# Patient Record
Sex: Female | Born: 1990
Health system: Southern US, Community
[De-identification: ages and names within clinical notes are randomized; demographics above are authoritative.]

## PROBLEM LIST (undated history)

## (undated) ENCOUNTER — Inpatient Hospital Stay: Payer: Self-pay

## (undated) DIAGNOSIS — F32A Depression, unspecified: Secondary | ICD-10-CM

## (undated) DIAGNOSIS — B019 Varicella without complication: Secondary | ICD-10-CM

## (undated) DIAGNOSIS — E538 Deficiency of other specified B group vitamins: Secondary | ICD-10-CM

## (undated) DIAGNOSIS — F419 Anxiety disorder, unspecified: Secondary | ICD-10-CM

## (undated) DIAGNOSIS — K589 Irritable bowel syndrome without diarrhea: Secondary | ICD-10-CM

## (undated) DIAGNOSIS — O139 Gestational [pregnancy-induced] hypertension without significant proteinuria, unspecified trimester: Secondary | ICD-10-CM

## (undated) DIAGNOSIS — F53 Postpartum depression: Secondary | ICD-10-CM

## (undated) DIAGNOSIS — O99345 Other mental disorders complicating the puerperium: Secondary | ICD-10-CM

## (undated) HISTORY — DX: Anxiety disorder, unspecified: F41.9

## (undated) HISTORY — DX: Other mental disorders complicating the puerperium: O99.345

## (undated) HISTORY — DX: Irritable bowel syndrome, unspecified: K58.9

## (undated) HISTORY — DX: Varicella without complication: B01.9

## (undated) HISTORY — DX: Depression, unspecified: F32.A

## (undated) HISTORY — DX: Deficiency of other specified B group vitamins: E53.8

## (undated) HISTORY — DX: Postpartum depression: F53.0

## (undated) HISTORY — PX: WISDOM TOOTH EXTRACTION: SHX21

## (undated) HISTORY — DX: Gestational (pregnancy-induced) hypertension without significant proteinuria, unspecified trimester: O13.9

---
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
## 2011-12-07 ENCOUNTER — Other Ambulatory Visit: Payer: Self-pay | Admitting: Gastroenterology

## 2011-12-22 ENCOUNTER — Other Ambulatory Visit: Payer: Self-pay | Admitting: Gastroenterology

## 2014-04-20 ENCOUNTER — Ambulatory Visit: Payer: Self-pay | Admitting: Obstetrics and Gynecology

## 2014-04-29 ENCOUNTER — Inpatient Hospital Stay: Payer: Self-pay

## 2014-04-29 DIAGNOSIS — O4290 Premature rupture of membranes, unspecified as to length of time between rupture and onset of labor, unspecified weeks of gestation: Secondary | ICD-10-CM

## 2014-04-29 LAB — CBC WITH DIFFERENTIAL/PLATELET
BASOS PCT: 0.3 %
Basophil #: 0 10*3/uL (ref 0.0–0.1)
Eosinophil #: 0.1 10*3/uL (ref 0.0–0.7)
Eosinophil %: 0.7 %
HCT: 39 % (ref 35.0–47.0)
HGB: 12.9 g/dL (ref 12.0–16.0)
LYMPHS ABS: 2 10*3/uL (ref 1.0–3.6)
LYMPHS PCT: 15.2 %
MCH: 29.7 pg (ref 26.0–34.0)
MCHC: 33.1 g/dL (ref 32.0–36.0)
MCV: 90 fL (ref 80–100)
MONOS PCT: 8.8 %
Monocyte #: 1.2 x10 3/mm — ABNORMAL HIGH (ref 0.2–0.9)
NEUTROS ABS: 9.9 10*3/uL — AB (ref 1.4–6.5)
NEUTROS PCT: 75 %
PLATELETS: 193 10*3/uL (ref 150–440)
RBC: 4.36 10*6/uL (ref 3.80–5.20)
RDW: 12.9 % (ref 11.5–14.5)
WBC: 13.3 10*3/uL — AB (ref 3.6–11.0)

## 2014-04-29 LAB — PROTEIN / CREATININE RATIO, URINE
CREATININE, URINE: 79.4 mg/dL (ref 30.0–125.0)
PROTEIN, RANDOM URINE: 86 mg/dL — AB (ref 0–12)
Protein/Creat. Ratio: 1083 mg/gCREAT — ABNORMAL HIGH (ref 0–200)

## 2014-04-30 LAB — GC/CHLAMYDIA PROBE AMP

## 2014-04-30 LAB — HEMATOCRIT: HCT: 31.9 % — AB (ref 35.0–47.0)

## 2014-08-09 NOTE — H&P (Signed)
L&D Evaluation:  History:  HPI 24yo G1P0000 presents at [redacted]w[redacted]d with complaint of leaking fluid and contractions since 480XK, pregnancy complicated by mild PIH x 1 week.   Presents with contractions, leaking fluid   Patient's Medical History endometriosis; anxiety; IBS; GERD   Patient's Surgical History T&A   Medications Pre Natal Vitamins  protonix   Allergies NKDA   Social History none   Family History Non-Contributory   ROS:  ROS All systems were reviewed.  HEENT, CNS, GI, GU, Respiratory, CV, Renal and Musculoskeletal systems were found to be normal.   Exam:  Vital Signs stable   General no apparent distress   Mental Status clear  anxious   Estimated Fetal Weight Average for gestational age   Fetal Position vtx   Edema 1+   Pelvic 5/100/0   Mebranes NTZ?   FHT normal rate with no decels   FHT Description Variable decelerations   Fetal Heart Rate 120   Ucx irregular   Ucx Frequency 5 min   Length of each Contraction 60 seconds   Impression:  Impression active labor, evaluation for PIH, PTL   Plan:  Plan EFM/NST, monitor contractions and for cervical change, monitor BP, PIH panel, antibiotics for GBBS prophylaxis, alert neonatology of PTL and GBS prophylaxis   Electronic Signatures: Evonnie Pat (CNM)  (Signed 29-Jan-16 10:53)  Authored: L&D Evaluation   Last Updated: 29-Jan-16 10:53 by Evonnie Pat (CNM)

## 2014-08-22 DIAGNOSIS — F53 Postpartum depression: Secondary | ICD-10-CM | POA: Insufficient documentation

## 2014-08-22 DIAGNOSIS — K219 Gastro-esophageal reflux disease without esophagitis: Secondary | ICD-10-CM | POA: Insufficient documentation

## 2014-08-22 DIAGNOSIS — N809 Endometriosis, unspecified: Secondary | ICD-10-CM

## 2014-08-22 DIAGNOSIS — F419 Anxiety disorder, unspecified: Secondary | ICD-10-CM

## 2014-08-22 DIAGNOSIS — F329 Major depressive disorder, single episode, unspecified: Secondary | ICD-10-CM | POA: Insufficient documentation

## 2014-08-22 DIAGNOSIS — R5383 Other fatigue: Secondary | ICD-10-CM | POA: Insufficient documentation

## 2014-08-22 DIAGNOSIS — O99345 Other mental disorders complicating the puerperium: Secondary | ICD-10-CM

## 2014-08-22 DIAGNOSIS — K589 Irritable bowel syndrome without diarrhea: Secondary | ICD-10-CM | POA: Insufficient documentation

## 2014-10-06 ENCOUNTER — Encounter: Payer: Self-pay | Admitting: Obstetrics and Gynecology

## 2014-10-06 ENCOUNTER — Ambulatory Visit (INDEPENDENT_AMBULATORY_CARE_PROVIDER_SITE_OTHER): Payer: BLUE CROSS/BLUE SHIELD | Admitting: Obstetrics and Gynecology

## 2014-10-06 VITALS — BP 125/82 | HR 63 | Ht 65.0 in | Wt 169.2 lb

## 2014-10-06 DIAGNOSIS — O99345 Other mental disorders complicating the puerperium: Principal | ICD-10-CM

## 2014-10-06 DIAGNOSIS — Z308 Encounter for other contraceptive management: Secondary | ICD-10-CM | POA: Diagnosis not present

## 2014-10-06 DIAGNOSIS — F53 Postpartum depression: Secondary | ICD-10-CM

## 2014-10-06 MED ORDER — PANTOPRAZOLE SODIUM 40 MG PO TBEC: 40.0000 mg | DELAYED_RELEASE_TABLET | Freq: Every day | ORAL | Status: DC

## 2014-10-06 MED ORDER — ESCITALOPRAM OXALATE 10 MG PO TABS: 10.0000 mg | ORAL_TABLET | Freq: Every day | ORAL | Status: DC

## 2014-10-06 MED ORDER — DESOGESTREL-ETHINYL ESTRADIOL 0.15-0.02/0.01 MG (21/5) PO TABS: 1.0000 | ORAL_TABLET | Freq: Every day | ORAL | Status: DC

## 2014-10-06 NOTE — Progress Notes (Signed)
Subjective:     Patient ID: Toni Mccormick, female   DOB: 09/25/1990, 24 y.o.   MRN: [id number]  HPI Here to review medications for PPD and [facility]  Review of Systems Feeling much better on lexapro, desires continuing at this time. Need to switch to regular HC as she is no longer breastfeeding,had first menses since delivery last week.    Objective:   Physical Exam A&O x 4 Well groomed, in no apparent distress. Pelvic exam not indicated Mood and affect appropriate without signs of anxiety or depression.    Assessment:     Postpartum depression under good control on current SSRI. [facility] management     Plan:     Refilled medication and change BC to [place].  RTC 4-6 weeks for AE.   Romyn  Trudee Kuster, CNM

## 2014-11-01 ENCOUNTER — Encounter: Payer: Self-pay | Admitting: Family Medicine

## 2014-11-01 ENCOUNTER — Ambulatory Visit (INDEPENDENT_AMBULATORY_CARE_PROVIDER_SITE_OTHER): Payer: BLUE CROSS/BLUE SHIELD | Admitting: Family Medicine

## 2014-11-01 VITALS — BP 112/82 | HR 69 | Temp 97.9°F | Ht 65.0 in | Wt 170.0 lb

## 2014-11-01 DIAGNOSIS — R5383 Other fatigue: Secondary | ICD-10-CM

## 2014-11-01 DIAGNOSIS — F419 Anxiety disorder, unspecified: Secondary | ICD-10-CM

## 2014-11-01 DIAGNOSIS — F418 Other specified anxiety disorders: Secondary | ICD-10-CM | POA: Diagnosis not present

## 2014-11-01 DIAGNOSIS — F329 Major depressive disorder, single episode, unspecified: Secondary | ICD-10-CM

## 2014-11-01 DIAGNOSIS — F32A Depression, unspecified: Secondary | ICD-10-CM

## 2014-11-01 NOTE — Assessment & Plan Note (Signed)
New problem. Unclear etiology. DDx - Related to social issues (25 month old, lack of sleep), Thyroid dysfunction, Anemia. Obtaining lab studies today: TSH, CBC, Vitamin D. Patient to continue healthy diet and exercise.

## 2014-11-01 NOTE — Progress Notes (Signed)
   Subjective:    Patient ID: Toni Mccormick, female    DOB: [birth year], 24 y.o.   MRN: [id number]  HPI 24 year old female presents to the clinic today to establish care. Chief complaint: Fatigue.  1) Fatigue  Patient reports that for the past several months she's been experiencing significant fatigue.  Patient states that she finds it difficult to concentrate at work. She is significant tiredness during the day.  She feels like this is out of portion to normal fatigue experienced postpartum.  She states that she is eating well and exercises regularly (5 times a week).  No reported new stressors as of late.  No exacerbating or relieving factors.  No other associated symptoms. No weight gain, constipation, hair loss, fever, chills.   2) Depression  Well controlled per her report.  She is doing well on lexapro.  PMH, Surgical Hx, Family Hx, Social History reviewed and updated as below.  Past Medical History  Diagnosis Date  . Post partum depression   . Chicken pox   . Gestational hypertension    Past Surgical History  Procedure Laterality Date  . Tonsillectomy and adenoidectomy Bilateral 1992   Family History  Problem Relation Age of Onset  . Hyperlipidemia Father   . Hypertension Father   . Hyperlipidemia Maternal Grandmother   . Cancer Maternal Grandfather     lung  . Diabetes Paternal Grandmother   . Arthritis Paternal Grandfather   . Cancer Paternal Grandfather     lung   History   Social History  . Marital Status: Married    Spouse Name: N/A  . Number of Children: N/A  . Years of Education: N/A   Social History Main Topics  . Smoking status: Never Smoker   . Smokeless tobacco: Never Used  . Alcohol Use: Yes     Comment: occasionally  . Drug Use: No  . Sexual Activity: Yes    Birth Control/ Protection: Pill   Other Topics Concern  . None   Social History Narrative   Review of Systems Per HPI. All other systems negative.      Objective: Blood pressure 112/82, pulse 69, temperature 97.9 F (36.6 C), temperature source Oral, height 5\' 5"  (1.651 m), weight 170 lb (77.111 kg), last menstrual period 10/06/2014, SpO2 97 %, not currently breastfeeding. Body mass index is 28.29 kg/(m^2).    Physical Exam  Constitutional: She is oriented to person, place, and time. She appears well-developed and well-nourished. No distress.  HENT:  Head: Normocephalic and atraumatic.  Nose: Nose normal.  Mouth/Throat: Oropharynx is clear and moist. No oropharyngeal exudate.  Normal TM's bilaterally.   Eyes: Conjunctivae are normal. No scleral icterus.  Neck: Neck supple. No thyromegaly present.  Cardiovascular: Normal rate and regular rhythm.   No murmur heard. Pulmonary/Chest: Effort normal and breath sounds normal. She has no wheezes. She has no rales.  Abdominal: Soft. She exhibits no distension. There is no tenderness. There is no rebound and no guarding.  Musculoskeletal: Normal range of motion. She exhibits no edema.  Lymphadenopathy:    She has no cervical adenopathy.  Neurological: She is alert and oriented to person, place, and time.  Skin: Skin is warm and dry. No rash noted.  Psychiatric:  Flat affect noted.  Vitals reviewed.     Assessment & Plan:  See Problem List

## 2014-11-01 NOTE — Patient Instructions (Signed)
It was to see you today.  We will call you with the lab results.  You can go ahead and start supplementation with Vitamin D while awaiting the results. Take Vitamin D3 1000 IU daily.  Follow up annually or sooner if needed.   Take care  Dr. Lacinda Axon

## 2014-11-01 NOTE — Assessment & Plan Note (Signed)
Stable. Patient to continue lexapro.

## 2014-11-01 NOTE — Progress Notes (Signed)
Pre visit review using our clinic review tool, if applicable. No additional management support is needed unless otherwise documented below in the visit note. 

## 2014-11-02 ENCOUNTER — Telehealth: Payer: Self-pay

## 2014-11-02 LAB — TSH: TSH: 0.77 u[IU]/mL (ref 0.35–4.50)

## 2014-11-02 LAB — VITAMIN D 25 HYDROXY (VIT D DEFICIENCY, FRACTURES): VITD: 20.33 ng/mL — AB (ref 30.00–100.00)

## 2014-11-02 LAB — CBC
HEMATOCRIT: 41.7 % (ref 36.0–46.0)
Hemoglobin: 13.9 g/dL (ref 12.0–15.0)
MCHC: 33.3 g/dL (ref 30.0–36.0)
MCV: 89.4 fl (ref 78.0–100.0)
PLATELETS: 309 10*3/uL (ref 150.0–400.0)
RBC: 4.67 Mil/uL (ref 3.87–5.11)
RDW: 13.3 % (ref 11.5–15.5)
WBC: 9.7 10*3/uL (ref 4.0–10.5)

## 2014-11-02 NOTE — Telephone Encounter (Signed)
Pt was called to give lab results, but there was no answer so a message was left to callback to get those lab results.

## 2014-11-03 ENCOUNTER — Telehealth: Payer: Self-pay

## 2014-11-03 NOTE — Telephone Encounter (Signed)
Pt was called to inform her of her lab results but there was no answer. i left a voicemail instructing for her to call me back to get those results.

## 2014-11-03 NOTE — Telephone Encounter (Signed)
Pt called back and i gave her the results of her lab work.

## 2014-11-03 NOTE — Telephone Encounter (Signed)
Patient would like a call back about results from her most recent labwork.

## 2014-11-10 ENCOUNTER — Encounter: Payer: BLUE CROSS/BLUE SHIELD | Admitting: Obstetrics and Gynecology

## 2014-12-16 ENCOUNTER — Telehealth: Payer: Self-pay | Admitting: Family Medicine

## 2014-12-16 NOTE — Telephone Encounter (Signed)
Decrease to 5 mg daily (1/2 tablet) for 1 week, then stop.

## 2014-12-16 NOTE — Telephone Encounter (Signed)
Please advise 

## 2014-12-16 NOTE — Telephone Encounter (Signed)
Pt was told about what decrease for one week then she could stop

## 2014-12-16 NOTE — Telephone Encounter (Signed)
Pt called about being on lexapro after when she had her child. Pt is having side a effects of having no sex drive. Pt wants to know the safest way to stop taking the medication. Call number [phone] during hours of 8:30-5 Mon-Fri.  Thank You!

## 2015-02-08 ENCOUNTER — Encounter: Payer: Self-pay | Admitting: Family Medicine

## 2015-02-08 ENCOUNTER — Ambulatory Visit (INDEPENDENT_AMBULATORY_CARE_PROVIDER_SITE_OTHER): Payer: BLUE CROSS/BLUE SHIELD | Admitting: Family Medicine

## 2015-02-08 VITALS — BP 116/82 | HR 88 | Temp 97.7°F | Ht 65.0 in | Wt 172.5 lb

## 2015-02-08 DIAGNOSIS — R45851 Suicidal ideations: Secondary | ICD-10-CM | POA: Diagnosis not present

## 2015-02-08 DIAGNOSIS — F419 Anxiety disorder, unspecified: Principal | ICD-10-CM

## 2015-02-08 DIAGNOSIS — F418 Other specified anxiety disorders: Secondary | ICD-10-CM

## 2015-02-08 DIAGNOSIS — F329 Major depressive disorder, single episode, unspecified: Secondary | ICD-10-CM

## 2015-02-08 MED ORDER — BUPROPION HCL ER (XL) 150 MG PO TB24: 150.0000 mg | ORAL_TABLET | Freq: Every day | ORAL | Status: DC

## 2015-02-08 NOTE — Patient Instructions (Signed)
Take the medication daily.  Call the number below to set up your appt (I was unable to do so due to the 2 week notice w/ your employer): [phone]  Follow up in ~ 2 weeks.  Take care  Dr. Lacinda Axon

## 2015-02-09 ENCOUNTER — Telehealth: Payer: Self-pay | Admitting: *Deleted

## 2015-02-09 ENCOUNTER — Other Ambulatory Visit: Payer: Self-pay

## 2015-02-09 DIAGNOSIS — F329 Major depressive disorder, single episode, unspecified: Secondary | ICD-10-CM

## 2015-02-09 DIAGNOSIS — F419 Anxiety disorder, unspecified: Principal | ICD-10-CM

## 2015-02-09 DIAGNOSIS — R45851 Suicidal ideations: Secondary | ICD-10-CM | POA: Insufficient documentation

## 2015-02-09 MED ORDER — BUPROPION HCL ER (XL) 150 MG PO TB24: 150.0000 mg | ORAL_TABLET | Freq: Every day | ORAL | Status: DC

## 2015-02-09 NOTE — Telephone Encounter (Signed)
Patient wanted to [facility] the office that she uses [employer] on [address] in [place]

## 2015-02-09 NOTE — Telephone Encounter (Signed)
Has been added to her chart

## 2015-02-09 NOTE — Assessment & Plan Note (Addendum)
Worsening after discontinuation of Lexapro and recent life stressors. I'm starting her on Wellbutrin (given sexual dysfunction on Lexapro).

## 2015-02-09 NOTE — Assessment & Plan Note (Addendum)
I am very concerned especially since she has a young child at home. She denies having a plan. I recommend that she see a psychologist/behavioral health specialist regarding therapy. She is in agreement regarding this but requests somebody who shares the same [place] values. I attempted to schedule her a visit at the [facility] office but was unable to do so due to her scheduling conflicts. I gave her the phone number so she can schedule her appointment. She states that she will do this. She is to follow-up with me in 2 weeks. I advised her as well as her husband that if suicidal thoughts recur or she thinks about developing a plan and she should go to the hospital for urgent evaluation. They are in agreement.

## 2015-02-09 NOTE — Progress Notes (Signed)
Subjective:  Patient ID: Toni Mccormick, female    DOB: Jan 03, 1991  Age: 24 y.o. MRN: [id number]  CC: Depression/Anxiety  HPI:  24 year old female presents to the clinic today with worsening anxiety and depression.  Patient previously had postpartum depression after the birth of her son earlier this year. She was placed on Lexapro by her OB/GYN office.  At our first visit she was doing well regarding her anxiety and depression. However she later developed decrease in sex drive which prompted her to discontinue the Lexapro.  Since that time she's had intermittent difficulty with anxiety and depression. She is also has some recent life stressors as well. Additionally, she has had a recent thought of suicide. She states that she had an image of her shooting herself and thought it would be quite easy. She denies any plan. She states that she was scared by this which prompted her to come in today.  Social Hx   Social History   Social History  . Marital Status: Married    Spouse Name: N/A  . Number of Children: N/A  . Years of Education: N/A   Social History Main Topics  . Smoking status: Never Smoker   . Smokeless tobacco: Never Used  . Alcohol Use: Yes     Comment: occasionally  . Drug Use: No  . Sexual Activity: Yes    Birth Control/ Protection: Pill   Other Topics Concern  . None   Social History Narrative   Review of Systems  Constitutional: Negative.   Psychiatric/Behavioral: Positive for suicidal ideas. The patient is nervous/anxious.    Objective:  BP 116/82 mmHg  Pulse 88  Temp(Src) 97.7 F (36.5 C) (Oral)  Ht 5\' 5"  (1.651 m)  Wt 172 lb 8 oz (78.245 kg)  BMI 28.71 kg/m2  SpO2 98%  Breastfeeding? No  BP/Weight 02/08/2015 [id number]  Systolic BP [id number]  Diastolic BP 82 82 82  Wt. (Lbs) 172.5 170 169.2  BMI 28.71 28.29 28.16   Physical Exam  Constitutional: She is oriented to person, place, and time. She appears well-developed. No distress.    HENT:  Head: Normocephalic and atraumatic.  Eyes: Conjunctivae are normal.  Pulmonary/Chest: Effort normal.  Neurological: She is alert and oriented to person, place, and time.  Psychiatric:  Flat affect.  Depressed mood.   Lab Results  Component Value Date   WBC 9.7 11/01/2014   HGB 13.9 11/01/2014   HCT 41.7 11/01/2014   PLT 309.0 11/01/2014   TSH 0.77 11/01/2014   Assessment & Plan:   Problem List Items Addressed This Visit    Suicidal thoughts    I am very concerned especially since she has a young child at home. She denies having a plan. I recommend that she see a psychologist/behavioral health specialist regarding therapy. She is in agreement regarding this but requests somebody who shares the same [place] values. I attempted to schedule her a visit at the [facility] office but was unable to do so due to her scheduling conflicts. I gave her the phone number so she can schedule her appointment. She states that she will do this. She is to follow-up with me in 2 weeks. I advised her as well as her husband that if suicidal thoughts recur or she thinks about developing a plan and she should go to the hospital for urgent evaluation. They are in agreement.      Anxiety and depression - Primary    Worsening after discontinuation of Lexapro  and recent life stressors. I'm starting her on Wellbutrin (given sexual dysfunction on Lexapro).           Meds ordered this encounter  Medications  . buPROPion (WELLBUTRIN XL) 150 MG 24 hr tablet    Sig: Take 1 tablet (150 mg total) by mouth daily.    Dispense:  30 tablet    Refill:  2    Follow-up: 2 weeks  Thersa Salt, DO

## 2015-03-02 ENCOUNTER — Telehealth: Payer: Self-pay | Admitting: Family Medicine

## 2015-03-02 NOTE — Telephone Encounter (Signed)
Pt called about needing her medication buPROPion (WELLBUTRIN XL) 150 MG 24 hr tablet. It needs to be called into her mail order pharmacy [facility] [phone]. Thank You!  FYI this message was on the triage voicemail.

## 2015-03-03 MED ORDER — BUPROPION HCL ER (XL) 150 MG PO TB24: 150.0000 mg | ORAL_TABLET | Freq: Every day | ORAL | Status: DC

## 2015-03-03 NOTE — Telephone Encounter (Signed)
Sent new RX to mail order pharmacy

## 2015-04-24 ENCOUNTER — Other Ambulatory Visit: Payer: Self-pay | Admitting: Family Medicine

## 2015-04-24 NOTE — Telephone Encounter (Signed)
Patient called to see what medications she'd like to discuss or refilled. To save her a visit if it was something we could fix here at the office without her coming in.

## 2015-04-25 ENCOUNTER — Ambulatory Visit: Payer: BLUE CROSS/BLUE SHIELD | Admitting: Family Medicine

## 2015-06-13 ENCOUNTER — Encounter: Payer: Self-pay | Admitting: Obstetrics and Gynecology

## 2015-06-13 ENCOUNTER — Ambulatory Visit (INDEPENDENT_AMBULATORY_CARE_PROVIDER_SITE_OTHER): Payer: BLUE CROSS/BLUE SHIELD | Admitting: Obstetrics and Gynecology

## 2015-06-13 ENCOUNTER — Other Ambulatory Visit: Payer: Self-pay | Admitting: Obstetrics and Gynecology

## 2015-06-13 VITALS — BP 131/91 | HR 74 | Ht 65.0 in | Wt 164.6 lb

## 2015-06-13 DIAGNOSIS — Z01419 Encounter for gynecological examination (general) (routine) without abnormal findings: Secondary | ICD-10-CM

## 2015-06-13 NOTE — Progress Notes (Signed)
  Subjective:     Toni Mccormick is a 25 y.o. female and is here for a comprehensive physical exam. The patient reports anxiety, but no panic attacks since switching to wellbutrin..  Social History   Social History  . Marital Status: Married    Spouse Name: N/A  . Number of Children: N/A  . Years of Education: N/A   Occupational History  . Not on file.   Social History Main Topics  . Smoking status: Never Smoker   . Smokeless tobacco: Never Used  . Alcohol Use: Yes     Comment: occasionally  . Drug Use: No  . Sexual Activity: Yes    Birth Control/ Protection: Pill   Other Topics Concern  . Not on file   Social History Narrative   Health Maintenance  Topic Date Due  . HIV Screening  05/19/2003  . TETANUS/TDAP  05/19/2007  . PAP SMEAR  05/18/2009  . INFLUENZA VACCINE  10/31/2014    The following portions of the patient's history were reviewed and updated as appropriate: allergies, current medications, past family history, past medical history, past social history, past surgical history and problem list.  Review of Systems Pertinent items noted in HPI and remainder of comprehensive ROS otherwise negative.   Objective:    General appearance: alert, cooperative and appears stated age Neck: no adenopathy, no carotid bruit, no JVD, supple, symmetrical, trachea midline and thyroid not enlarged, symmetric, no tenderness/mass/nodules Lungs: clear to auscultation bilaterally Breasts: normal appearance, no masses or tenderness Heart: regular rate and rhythm, S1, S2 normal, no murmur, click, rub or gallop Abdomen: soft, non-tender; bowel sounds normal; no masses,  no organomegaly Pelvic: cervix normal in appearance, external genitalia normal, no adnexal masses or tenderness, no cervical motion tenderness, rectovaginal septum normal, uterus normal size, shape, and consistency and vagina normal without discharge    Assessment:    Healthy female exam. Overweight; anxiety  with depression, GERD, OCP use      Plan:  Pap obtained, counseled on increasing wellbutrin to 300mg  daily- states she will consider.  RTC 1 year or as needed.   See After Visit Summary for Counseling Recommendations

## 2015-06-13 NOTE — Patient Instructions (Signed)
Place annual gynecologic exam patient instructions here.

## 2015-06-14 LAB — CYTOLOGY - PAP

## 2015-07-11 ENCOUNTER — Other Ambulatory Visit: Payer: Self-pay | Admitting: Family Medicine

## 2015-07-30 IMAGING — US US EXTREM LOW VENOUS*R*
1 series · 14 of 24 positions shown · non-contrast
Comparison: None.

CLINICAL DATA: Right leg swelling

EXAM:
RIGHT LOWER EXTREMITY VENOUS DUPLEX ULTRASOUND
TECHNIQUE: Doppler venous assessment of the left lower extremity deep venous
system was performed, including characterization of spectral flow,
compressibility, and phasicity.

[Series 1: us extrem low venous*right* · 0.09mm/px · 14 of 34 slices shown]
[im 1/34]
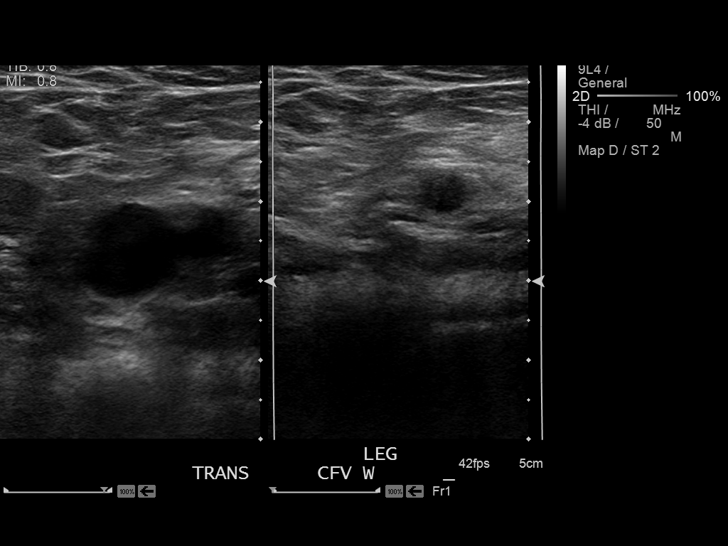
[im 3/34]
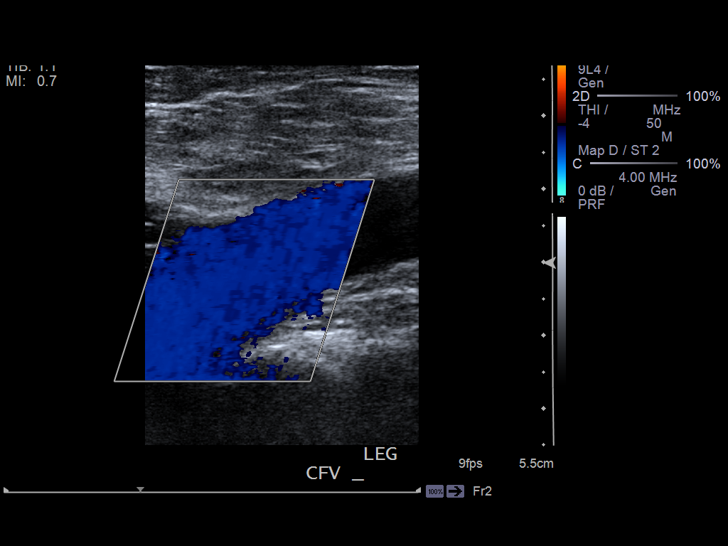
[im 6/34]
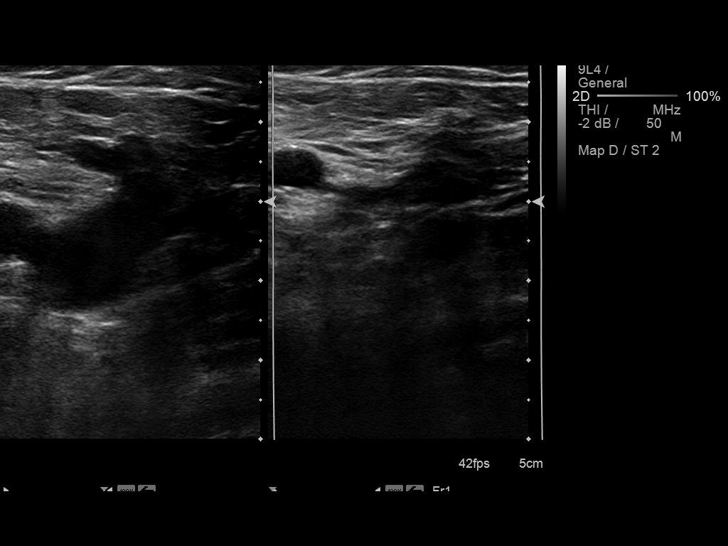
[im 9/34]
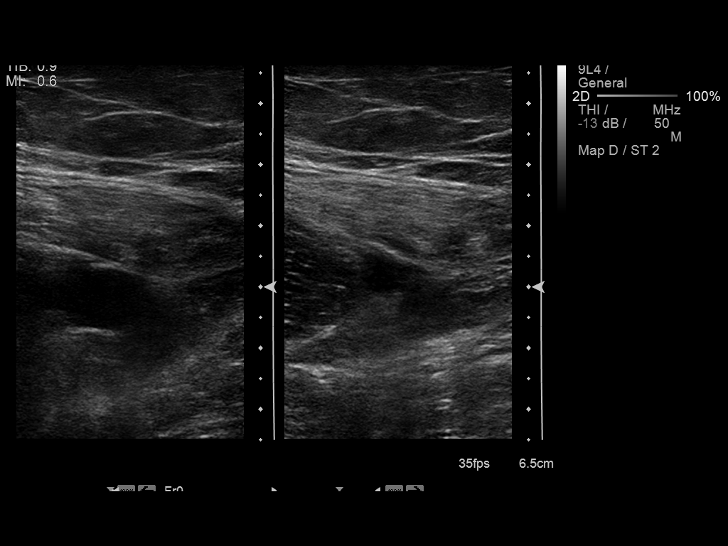
[im 11/34]
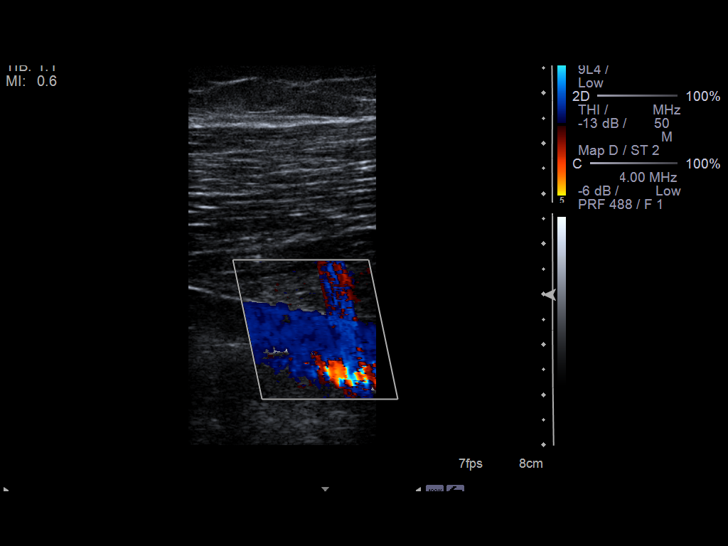
[im 13/34]
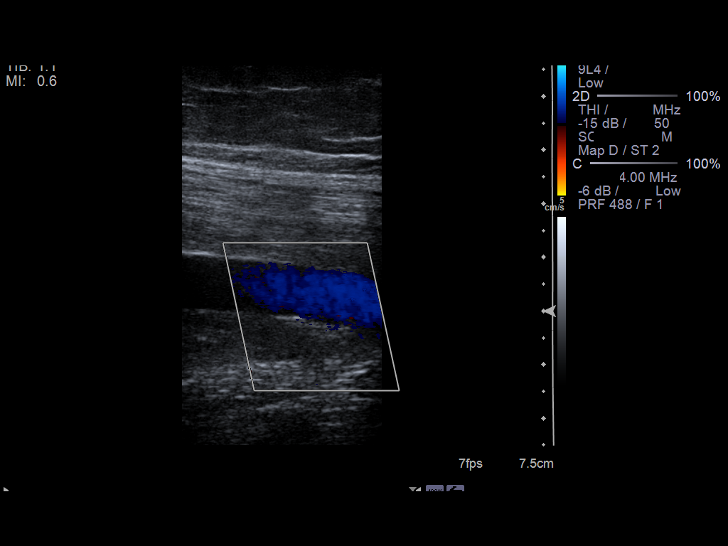
[im 16/34]
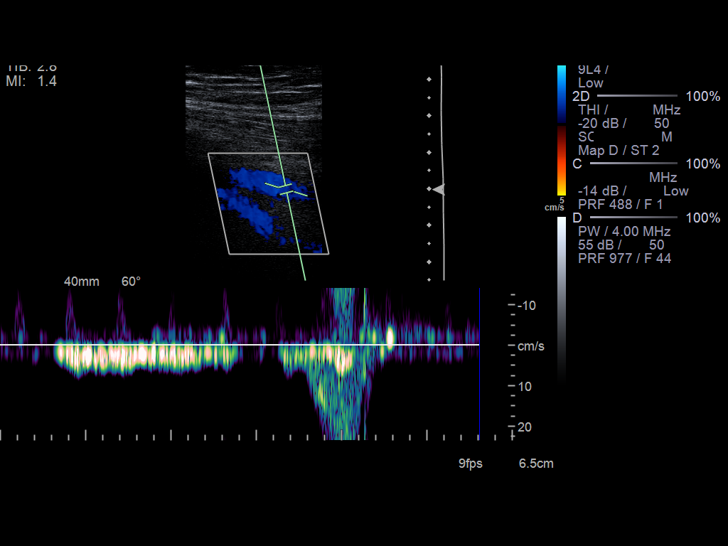
[im 18/34]
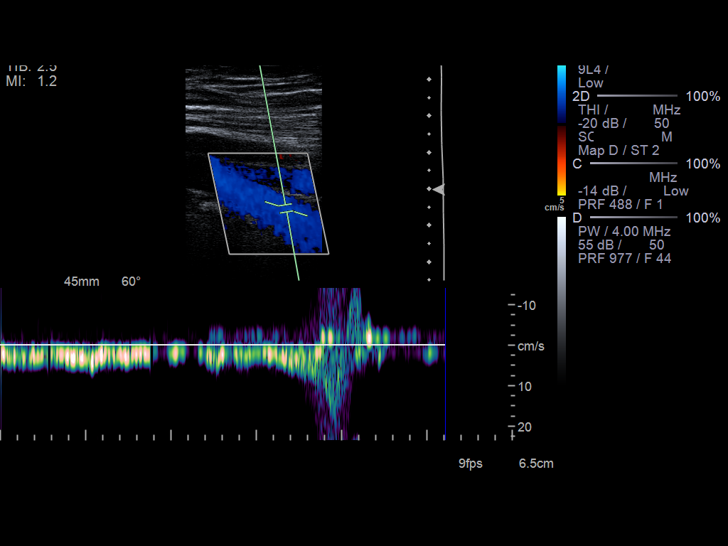
[im 21/34]
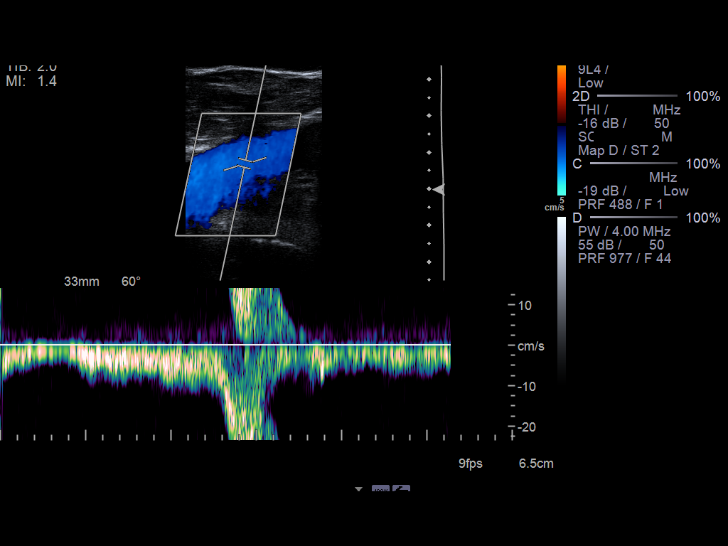
[im 23/34]
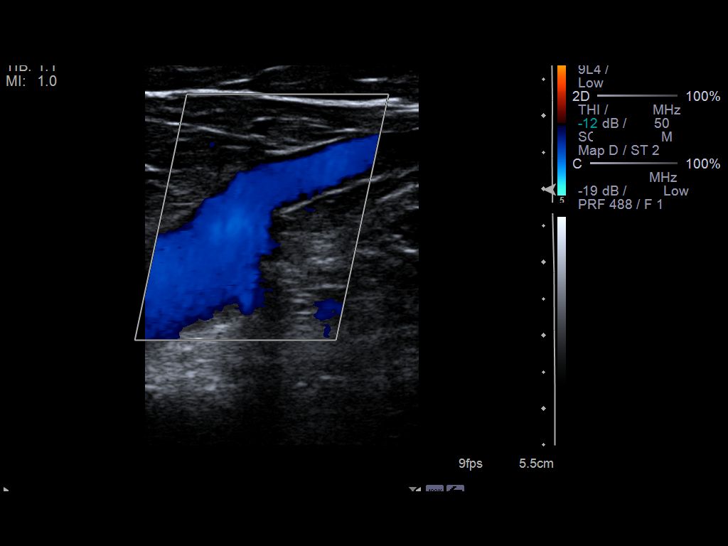
[im 26/34]
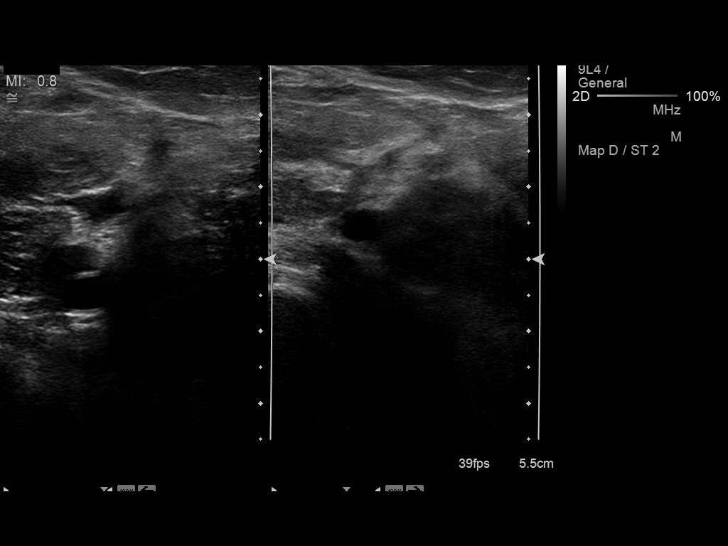
[im 28/34]
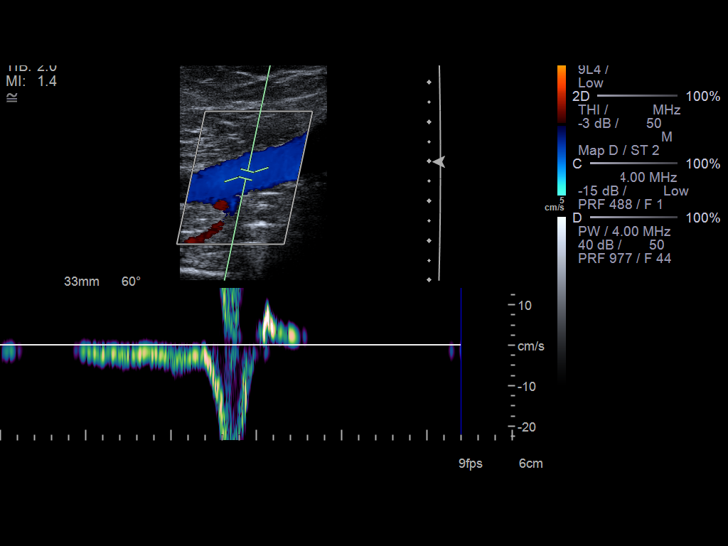
[im 31/34]
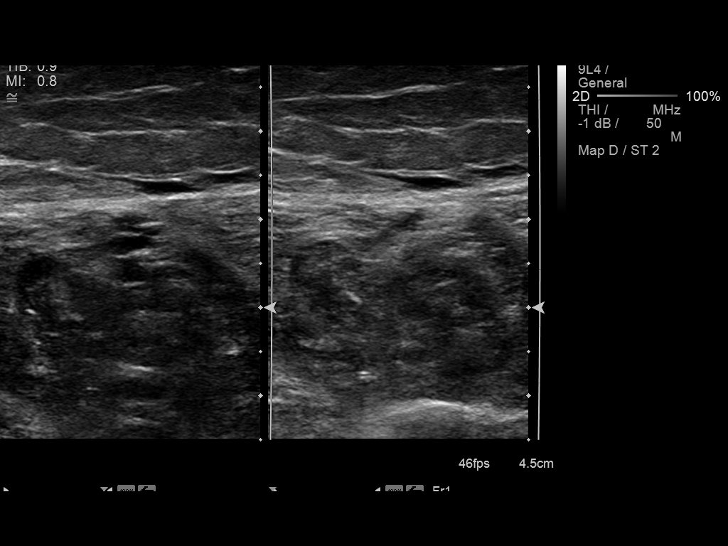
[im 34/34]
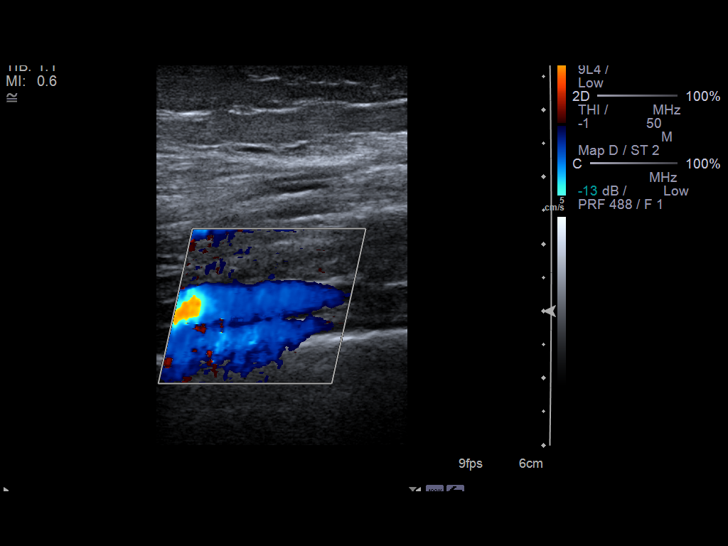

[14 of 24 positions shown; findings below may reference images not displayed]

FINDINGS: There is complete compressibility of the right common femoral,
femoral, and popliteal veins. Doppler analysis demonstrates
respiratory phasicity and augmentation of flow upon calf
compression. No obvious calf vein thrombosis.
IMPRESSION: No evidence of right lower extremity DVT.

## 2015-08-22 ENCOUNTER — Telehealth: Payer: Self-pay | Admitting: Family Medicine

## 2015-08-22 MED ORDER — BUPROPION HCL ER (XL) 150 MG PO TB24: ORAL_TABLET | ORAL | Status: DC

## 2015-08-22 NOTE — Telephone Encounter (Signed)
Pt needs a refill for the buPROPion (WELLBUTRIN XL) 150 MG 24 hr tablet. Pharmacy is [facility], [place]. Pt would like one week Rx. Call pt @ [phone]. Thank you!

## 2015-08-24 ENCOUNTER — Telehealth: Payer: Self-pay | Admitting: *Deleted

## 2015-08-24 ENCOUNTER — Other Ambulatory Visit: Payer: Self-pay | Admitting: Family Medicine

## 2015-08-24 NOTE — Telephone Encounter (Signed)
Patient has requested to have bupropion refilled, 90 day supply  Pharmacy [facility]

## 2015-08-25 MED ORDER — BUPROPION HCL ER (XL) 150 MG PO TB24: ORAL_TABLET | ORAL | Status: DC

## 2015-08-25 NOTE — Telephone Encounter (Signed)
Toni Mccormick,   Refill please.

## 2015-08-25 NOTE — Addendum Note (Signed)
Addended by: Carmin Muskrat on: 08/25/2015 08:40 AM   Modules accepted: Orders

## 2015-08-25 NOTE — Telephone Encounter (Signed)
Refilled

## 2015-09-18 ENCOUNTER — Telehealth: Payer: Self-pay | Admitting: Obstetrics and Gynecology

## 2015-09-18 NOTE — Telephone Encounter (Signed)
Patient called stating she is starting her period earlier and earlier every month with her currrent b/c. She didn't know what she should do. Thanks

## 2015-09-18 NOTE — Telephone Encounter (Signed)
Left message for pt that I would send message to MNS and she would return to office on Thursday. She should hear something Thursday or Friday. If not call back please.

## 2015-09-20 ENCOUNTER — Other Ambulatory Visit: Payer: Self-pay | Admitting: Obstetrics and Gynecology

## 2015-09-20 MED ORDER — NORGESTIMATE-ETH ESTRADIOL 0.25-35 MG-MCG PO TABS: 1.0000 | ORAL_TABLET | Freq: Every day | ORAL | Status: DC

## 2015-09-20 NOTE — Telephone Encounter (Signed)
Pt.notified

## 2015-09-20 NOTE — Telephone Encounter (Signed)
Please let her know I sent in a new pill, to switch over to it as soon as she pick it up.

## 2015-09-29 ENCOUNTER — Other Ambulatory Visit: Payer: Self-pay | Admitting: Obstetrics and Gynecology

## 2015-12-06 ENCOUNTER — Other Ambulatory Visit: Payer: Self-pay | Admitting: Obstetrics and Gynecology

## 2016-04-04 ENCOUNTER — Other Ambulatory Visit: Payer: Self-pay | Admitting: *Deleted

## 2016-04-04 ENCOUNTER — Telehealth: Payer: Self-pay | Admitting: Obstetrics and Gynecology

## 2016-04-04 MED ORDER — NORGESTIMATE-ETH ESTRADIOL 0.25-35 MG-MCG PO TABS: 1.0000 | ORAL_TABLET | Freq: Every day | ORAL | 2 refills | Status: DC

## 2016-04-04 NOTE — Telephone Encounter (Signed)
PT NEEDS A REFILL ON HER BC HOWEVER HER NEXT AE IS 07/04/16, CAN WE CALL IN A SUPPLY 3 MONTH TO HER MAIL ORDER TO LAST HER UNTIL HER APPT IN April FOR HER AE.

## 2016-04-04 NOTE — Telephone Encounter (Signed)
Done-ac 

## 2016-04-12 ENCOUNTER — Other Ambulatory Visit: Payer: Self-pay | Admitting: *Deleted

## 2016-04-12 MED ORDER — NORGESTIMATE-ETH ESTRADIOL 0.25-35 MG-MCG PO TABS: 1.0000 | ORAL_TABLET | Freq: Every day | ORAL | 0 refills | Status: DC

## 2016-07-03 ENCOUNTER — Telehealth: Payer: Self-pay | Admitting: Obstetrics and Gynecology

## 2016-07-03 ENCOUNTER — Other Ambulatory Visit: Payer: Self-pay | Admitting: *Deleted

## 2016-07-03 MED ORDER — NORGESTIMATE-ETH ESTRADIOL 0.25-35 MG-MCG PO TABS: 1.0000 | ORAL_TABLET | Freq: Every day | ORAL | 0 refills | Status: DC

## 2016-07-03 NOTE — Telephone Encounter (Signed)
Call to patient to rs her appt from tomorrow - she will need a refill for her birth control for May  [facility]  Patient # [phone]

## 2016-07-03 NOTE — Telephone Encounter (Signed)
Done-ac 

## 2016-07-04 ENCOUNTER — Encounter: Payer: BLUE CROSS/BLUE SHIELD | Admitting: Obstetrics and Gynecology

## 2016-09-24 ENCOUNTER — Ambulatory Visit (INDEPENDENT_AMBULATORY_CARE_PROVIDER_SITE_OTHER): Payer: BLUE CROSS/BLUE SHIELD | Admitting: Obstetrics and Gynecology

## 2016-09-24 ENCOUNTER — Encounter: Payer: Self-pay | Admitting: Obstetrics and Gynecology

## 2016-09-24 VITALS — BP 122/82 | HR 67 | Ht 65.0 in | Wt 168.6 lb

## 2016-09-24 DIAGNOSIS — Z01419 Encounter for gynecological examination (general) (routine) without abnormal findings: Secondary | ICD-10-CM | POA: Diagnosis not present

## 2016-09-24 DIAGNOSIS — E559 Vitamin D deficiency, unspecified: Secondary | ICD-10-CM | POA: Diagnosis not present

## 2016-09-24 DIAGNOSIS — K580 Irritable bowel syndrome with diarrhea: Secondary | ICD-10-CM | POA: Diagnosis not present

## 2016-09-24 MED ORDER — NORGESTIMATE-ETH ESTRADIOL 0.25-35 MG-MCG PO TABS: 1.0000 | ORAL_TABLET | Freq: Every day | ORAL | 4 refills | Status: DC

## 2016-09-24 NOTE — Patient Instructions (Signed)
Diet for Irritable Bowel Syndrome When you have irritable bowel syndrome (IBS), the foods you eat and your eating habits are very important. IBS may cause various symptoms, such as abdominal pain, constipation, or diarrhea. Choosing the right foods can help ease discomfort caused by these symptoms. Work with your health care provider and dietitian to find the best eating plan to help control your symptoms. What general guidelines do I need to follow?  Keep a food diary. This will help you identify foods that cause symptoms. Write down: ? What you eat and when. ? What symptoms you have. ? When symptoms occur in relation to your meals.  Avoid foods that cause symptoms. Talk with your dietitian about other ways to get the same nutrients that are in these foods.  Eat more foods that contain fiber. Take a fiber supplement if directed by your dietitian.  Eat your meals slowly, in a relaxed setting.  Aim to eat 5-6 small meals per day. Do not skip meals.  Drink enough fluids to keep your urine clear or pale yellow.  Ask your health care provider if you should take an over-the-counter probiotic during flare-ups to help restore healthy gut bacteria.  If you have cramping or diarrhea, try making your meals low in fat and high in carbohydrates. Examples of carbohydrates are pasta, rice, whole grain breads and cereals, fruits, and vegetables.  If dairy products cause your symptoms to flare up, try eating less of them. You might be able to handle yogurt better than other dairy products because it contains bacteria that help with digestion. What foods are not recommended? The following are some foods and drinks that may worsen your symptoms:  Fatty foods, such as French fries.  Milk products, such as cheese or ice cream.  Chocolate.  Alcohol.  Products with caffeine, such as coffee.  Carbonated drinks, such as soda.  The items listed above may not be a complete list of foods and beverages to  avoid. Contact your dietitian for more information. What foods are good sources of fiber? Your health care provider or dietitian may recommend that you eat more foods that contain fiber. Fiber can help reduce constipation and other IBS symptoms. Add foods with fiber to your diet a little at a time so that your body can get used to them. Too much fiber at once might cause gas and swelling of your abdomen. The following are some foods that are good sources of fiber:  Apples.  Peaches.  Pears.  Berries.  Figs.  Broccoli (raw).  Cabbage.  Carrots.  Raw peas.  Kidney beans.  Lima beans.  Whole grain bread.  Whole grain cereal.  Where to find more information: International Foundation for Functional Gastrointestinal Disorders: www.iffgd.org National Institute of Diabetes and Digestive and Kidney Diseases: www.niddk.nih.gov/health-information/health-topics/digestive-diseases/ibs/Pages/facts.aspx This information is not intended to replace advice given to you by your health care provider. Make sure you discuss any questions you have with your health care provider. Document Released: 06/08/2003 Document Revised: 08/24/2015 Document Reviewed: 06/18/2013 Elsevier Interactive Patient Education  2018 Elsevier Inc.  

## 2016-09-24 NOTE — Progress Notes (Signed)
   Subjective:     Toni Mccormick is a married white female 26 y.o. female and is here for a comprehensive physical exam. The patient reports problems - anxiety about the same. worse since starting new job at [employer] exercising and has to sit at desk all day.  considering another pregnancy this winter. declines restarting lexapro due to decreased sex drive, and weight gain.  Social History   Social History  . Marital status: Married    Spouse name: N/A  . Number of children: N/A  . Years of education: N/A   Occupational History  . Not on file.   Social History Main Topics  . Smoking status: Never Smoker  . Smokeless tobacco: Never Used  . Alcohol use Yes     Comment: occasionally  . Drug use: No  . Sexual activity: Yes    Birth control/ protection: Pill   Other Topics Concern  . Not on file   Social History Narrative  . No narrative on file   Health Maintenance  Topic Date Due  . HIV Screening  05/19/2003  . TETANUS/TDAP  05/19/2007  . INFLUENZA VACCINE  10/30/2016  . PAP SMEAR  06/13/2018    The following portions of the patient's history were reviewed and updated as appropriate: allergies, current medications, past family history, past medical history, past social history, past surgical history and problem list.  Review of Systems Pertinent items noted in HPI and remainder of comprehensive ROS otherwise negative.   Objective:    General appearance: alert, cooperative and appears stated age Neck: no adenopathy, no carotid bruit, no JVD, supple, symmetrical, trachea midline and thyroid not enlarged, symmetric, no tenderness/mass/nodules Lungs: clear to auscultation bilaterally Breasts: normal appearance, no masses or tenderness Heart: regular rate and rhythm, S1, S2 normal, no murmur, click, rub or gallop Abdomen: soft, non-tender; bowel sounds normal; no masses,  no organomegaly Pelvic: cervix normal in appearance, external genitalia normal, no adnexal  masses or tenderness, no cervical motion tenderness, rectovaginal septum normal, uterus normal size, shape, and consistency and vagina normal without discharge    Assessment:    Healthy female exam. IBS, anxiety, OCP user.     Plan:  To continue with OCP at this time Recheck labs. RTC in 1 year or as needed,  Melody Trinity, CNM   See After Visit Summary for Counseling Recommendations

## 2016-09-26 LAB — COMPREHENSIVE METABOLIC PANEL
ALBUMIN: 4.5 g/dL (ref 3.5–5.5)
ALK PHOS: 46 IU/L (ref 39–117)
ALT: 12 IU/L (ref 0–32)
AST: 15 IU/L (ref 0–40)
Albumin/Globulin Ratio: 1.5 (ref 1.2–2.2)
BILIRUBIN TOTAL: 0.4 mg/dL (ref 0.0–1.2)
BUN / CREAT RATIO: 18 (ref 9–23)
BUN: 14 mg/dL (ref 6–20)
CHLORIDE: 99 mmol/L (ref 96–106)
CO2: 24 mmol/L (ref 20–29)
Calcium: 9.8 mg/dL (ref 8.7–10.2)
Creatinine, Ser: 0.78 mg/dL (ref 0.57–1.00)
GFR calc Af Amer: 120 mL/min/{1.73_m2} (ref >59)
GFR calc non Af Amer: 104 mL/min/{1.73_m2} (ref >59)
Globulin, Total: 3 g/dL (ref 1.5–4.5)
Glucose: 94 mg/dL (ref 65–99)
Potassium: 4.4 mmol/L (ref 3.5–5.2)
Sodium: 139 mmol/L (ref 134–144)
Total Protein: 7.5 g/dL (ref 6.0–8.5)

## 2016-09-26 LAB — FOOD ALLERGY PROFILE
Allergen Corn, IgE: <0.1 kU/L
Codfish IgE: <0.1 kU/L
Egg White IgE: <0.1 kU/L
Milk IgE: <0.1 kU/L
Scallop IgE: <0.1 kU/L
Sesame Seed IgE: 0.11 kU/L — AB
Wheat IgE: <0.1 kU/L

## 2016-09-26 LAB — THYROID PANEL WITH TSH
FREE THYROXINE INDEX: 1.9 (ref 1.2–4.9)
T3 UPTAKE RATIO: 19 % — AB (ref 24–39)
T4 TOTAL: 9.8 ug/dL (ref 4.5–12.0)
TSH: 0.777 u[IU]/mL (ref 0.450–4.500)

## 2016-09-26 LAB — VITAMIN D 25 HYDROXY (VIT D DEFICIENCY, FRACTURES): VIT D 25 HYDROXY: 40.7 ng/mL (ref 30.0–100.0)

## 2016-11-19 ENCOUNTER — Telehealth: Payer: Self-pay | Admitting: Obstetrics and Gynecology

## 2016-11-19 ENCOUNTER — Other Ambulatory Visit: Payer: Self-pay | Admitting: *Deleted

## 2016-11-19 MED ORDER — PANTOPRAZOLE SODIUM 40 MG PO TBEC: DELAYED_RELEASE_TABLET | ORAL | 2 refills | Status: DC

## 2016-11-19 NOTE — Telephone Encounter (Signed)
Done-ac 

## 2016-11-19 NOTE — Telephone Encounter (Signed)
Pt contacted office for refill request on the following medications:  pantoprazole (PROTONIX) 40 MG tablet 90 day supply  [facility]  Last Rx: 12/06/15 LOV: 09/24/16 Please advise. Thanks TNP

## 2017-04-01 NOTE — L&D Delivery Note (Addendum)
Delivery Note  0750 In room to see patient, reports pelvic pressure and the urge to push. SVE: 10/100/+2, vertex. Effective maternal pushing efforts noted.   9166 Spontaneous vaginal birth of liveborn female infant in right occiput anterior position, loose double nuchal cord reduced on perineum. Infant immediately to maternal abdomen. Delayed cord clamping, three (3) vessel cord. APGARs: 8, 9. Weight: 3260 g (7 lbd +3 oz). Receiving nurse present at bedside for birth.   Pitocin bolus infusing. Delivery of intact placenta at 0804. Periurethral and second degree perineal laceration repaired with 3-0 vicryl rapide under epidural anesthesia. Lacerations hemostatic and well approximated. Uterus firm. Lochia: small.  QBL: 445 ml. Vault check completed. Counts correct x 2.   Initiate routine postpartum care and orders. Mom to postpartum.  Baby to Couplet care / Skin to Skin.  Patient's mother and FOB present at bedside and overjoyed with the birth of "Hudson".    Diona Fanti, CNM [facility] 11/25/2017, 8:28 AM

## 2017-04-03 ENCOUNTER — Telehealth: Payer: Self-pay | Admitting: Obstetrics and Gynecology

## 2017-04-03 NOTE — Telephone Encounter (Signed)
Left pt detailed message.

## 2017-04-03 NOTE — Telephone Encounter (Signed)
The patient called and stated that she would like to stay on the medication if possible (pantoprazole (PROTONIX) 40 MG tablet) The patient would also like for a nurse to call back in regards to having some questions about the medication."Is it okay to take it if she is now pregnant"  The patient also stated that she is not able to answer the phone and a voicemail is okay. Please advise.

## 2017-05-01 ENCOUNTER — Ambulatory Visit (INDEPENDENT_AMBULATORY_CARE_PROVIDER_SITE_OTHER): Payer: BLUE CROSS/BLUE SHIELD | Admitting: Certified Nurse Midwife

## 2017-05-01 ENCOUNTER — Encounter: Payer: Self-pay | Admitting: Certified Nurse Midwife

## 2017-05-01 ENCOUNTER — Encounter: Payer: BLUE CROSS/BLUE SHIELD | Admitting: Obstetrics and Gynecology

## 2017-05-01 VITALS — BP 128/82 | HR 75 | Ht 65.0 in | Wt 174.0 lb

## 2017-05-01 DIAGNOSIS — O26899 Other specified pregnancy related conditions, unspecified trimester: Secondary | ICD-10-CM | POA: Diagnosis not present

## 2017-05-01 DIAGNOSIS — R0602 Shortness of breath: Secondary | ICD-10-CM

## 2017-05-01 DIAGNOSIS — N926 Irregular menstruation, unspecified: Secondary | ICD-10-CM | POA: Diagnosis not present

## 2017-05-01 DIAGNOSIS — I499 Cardiac arrhythmia, unspecified: Secondary | ICD-10-CM | POA: Diagnosis not present

## 2017-05-01 DIAGNOSIS — O26851 Spotting complicating pregnancy, first trimester: Secondary | ICD-10-CM

## 2017-05-01 DIAGNOSIS — R109 Unspecified abdominal pain: Secondary | ICD-10-CM

## 2017-05-01 LAB — POCT URINE PREGNANCY: Preg Test, Ur: POSITIVE — AB

## 2017-05-01 NOTE — Patient Instructions (Signed)
Abdominal Pain During Pregnancy Belly (abdominal) pain is common during pregnancy. Most of the time, it is not a serious problem. Other times, it can be a sign that something is wrong with the pregnancy. Always tell your doctor if you have belly pain. Follow these instructions at home: Monitor your belly pain for any changes. The following actions may help you feel better:  Do not have sex (intercourse) or put anything in your vagina until you feel better.  Rest until your pain stops.  Drink clear fluids if you feel sick to your stomach (nauseous). Do not eat solid food until you feel better.  Only take medicine as told by your doctor.  Keep all doctor visits as told.  Get help right away if:  You are bleeding, leaking fluid, or pieces of tissue come out of your vagina.  You have more pain or cramping.  You keep throwing up (vomiting).  You have pain when you pee (urinate) or have blood in your pee.  You have a fever.  You do not feel your baby moving as much.  You feel very weak or feel like passing out.  You have trouble breathing, with or without belly pain.  You have a very bad headache and belly pain.  You have fluid leaking from your vagina and belly pain.  You keep having watery poop (diarrhea).  Your belly pain does not go away after resting, or the pain gets worse. This information is not intended to replace advice given to you by your health care provider. Make sure you discuss any questions you have with your health care provider. Document Released: 03/06/2009 Document Revised: 10/25/2015 Document Reviewed: 10/15/2012 Elsevier Interactive Patient Education  2018 [employer]. Back Pain in Pregnancy Back pain during pregnancy is common. Back pain may be caused by several factors that are related to changes during your pregnancy. Follow these instructions at home: Managing pain, stiffness, and swelling  If directed, apply ice for sudden (acute) back  pain. ? Put ice in a plastic bag. ? Place a towel between your skin and the bag. ? Leave the ice on for 20 minutes, 2-3 times per day.  If directed, apply heat to the affected area before you exercise: ? Place a towel between your skin and the heat pack or heating pad. ? Leave the heat on for 20-30 minutes. ? Remove the heat if your skin turns bright red. This is especially important if you are unable to feel pain, heat, or cold. You may have a greater risk of getting burned. Activity  Exercise as told by your health care provider. Exercising is the best way to prevent or manage back pain.  Listen to your body when lifting. If lifting hurts, ask for help or bend your knees. This uses your leg muscles instead of your back muscles.  Squat down when picking up something from the floor. Do not bend over.  Only use bed rest as told by your health care provider. Bed rest should only be used for the most severe episodes of back pain. Standing, Sitting, and Lying Down  Do not stand in one place for long periods of time.  Use good posture when sitting. Make sure your head rests over your shoulders and is not hanging forward. Use a pillow on your lower back if necessary.  Try sleeping on your side, preferably the left side, with a pillow or two between your legs. If you are sore after a night's rest, your bed may  be too soft. A firm mattress may provide more support for your back during pregnancy. General instructions  Do not wear high heels.  Eat a healthy diet. Try to gain weight within your health care provider's recommendations.  Use a maternity girdle, elastic sling, or back brace as told by your health care provider.  Take over-the-counter and prescription medicines only as told by your health care provider.  Keep all follow-up visits as told by your health care provider. This is important. This includes any visits with any specialists, such as a physical therapist. Contact a health  care provider if:  Your back pain interferes with your daily activities.  You have increasing pain in other parts of your body. Get help right away if:  You develop numbness, tingling, weakness, or problems with the use of your arms or legs.  You develop severe back pain that is not controlled with medicine.  You have a sudden change in bowel or bladder control.  You develop shortness of breath, dizziness, or you faint.  You develop nausea, vomiting, or sweating.  You have back pain that is a rhythmic, cramping pain similar to labor pains. Labor pain is usually 1-2 minutes apart, lasts for about 1 minute, and involves a bearing down feeling or pressure in your pelvis.  You have back pain and your water breaks or you have vaginal bleeding.  You have back pain or numbness that travels down your leg.  Your back pain developed after you fell.  You develop pain on one side of your back.  You see blood in your urine.  You develop skin blisters in the area of your back pain. This information is not intended to replace advice given to you by your health care provider. Make sure you discuss any questions you have with your health care provider. Document Released: 06/26/2005 Document Revised: 08/24/2015 Document Reviewed: 11/30/2014 Elsevier Interactive Patient Education  2018 [employer]. Common Medications Safe in Pregnancy  Acne:      Constipation:  Benzoyl Peroxide     Colace  Clindamycin      Dulcolax Suppository  Topica Erythromycin     Fibercon  Salicylic Acid      Metamucil         Miralax AVOID:        Senakot   Accutane    Cough:  Retin-A       Cough Drops  Tetracycline      Phenergan w/ Codeine if Rx  Minocycline      Robitussin (Plain & DM)  Antibiotics:     Crabs/Lice:  Ceclor       RID  Cephalosporins    AVOID:  E-Mycins      Kwell  Keflex  Macrobid/Macrodantin   Diarrhea:  Penicillin      Kao-Pectate  Zithromax      Imodium AD         PUSH  FLUIDS AVOID:       Cipro     Fever:  Tetracycline      Tylenol (Regular or Extra  Minocycline       Strength)  Levaquin      Extra Strength-Do not          Exceed 8 tabs/24 hrs Caffeine:        <232m/day (equiv. To 1 cup of coffee or  approx. 3 12 oz sodas)         Gas: Cold/Hayfever:       Gas-X  Benadryl  Mylicon  Claritin       Phazyme  **Claritin-D        Chlor-Trimeton    Headaches:  Dimetapp      ASA-Free Excedrin  Drixoral-Non-Drowsy     Cold Compress  Mucinex (Guaifenasin)     Tylenol (Regular or Extra  Sudafed/Sudafed-12 Hour     Strength)  **Sudafed PE Pseudoephedrine   Tylenol Cold & Sinus     Vicks Vapor Rub  Zyrtec  **AVOID if Problems With Blood Pressure         Heartburn: Avoid lying down for at least 1 hour after meals  Aciphex      Maalox     Rash:  Milk of Magnesia     Benadryl    Mylanta       1% Hydrocortisone Cream  Pepcid  Pepcid Complete   Sleep Aids:  Prevacid      Ambien   Prilosec       Benadryl  Rolaids       Chamomile Tea  Tums (Limit 4/day)     Unisom  Zantac       Tylenol PM         Warm milk-add vanilla or  Hemorrhoids:       Sugar for taste  Anusol/Anusol H.C.  (RX: Analapram 2.5%)  Sugar Substitutes:  Hydrocortisone OTC     Ok in moderation  Preparation H      Tucks        Vaseline lotion applied to tissue with wiping    Herpes:     Throat:  Acyclovir      Oragel  Famvir  Valtrex     Vaccines:         Flu Shot Leg Cramps:       *Gardasil  Benadryl      Hepatitis A         Hepatitis B Nasal Spray:       Pneumovax  Saline Nasal Spray     Polio Booster         Tetanus Nausea:       Tuberculosis test or PPD  Vitamin B6 25 mg TID   AVOID:    Dramamine      *Gardasil  Emetrol       Live Poliovirus  Ginger Root 250 mg QID    MMR (measles, mumps &  High Complex Carbs @ Bedtime    rebella)  Sea Bands-Accupressure    Varicella (Chickenpox)  Unisom 1/2 tab TID     *No known complications           If received  before Pain:         Known pregnancy;   Darvocet       Resume series after  Lortab        Delivery  Percocet    Yeast:   Tramadol      Femstat  Tylenol 3      Gyne-lotrimin  Ultram       Monistat  Vicodin           MISC:         All Sunscreens           Hair Coloring/highlights          Insect Repellant's          (Including DEET)         Mystic Tans First Trimester of Pregnancy The first trimester of pregnancy is from week 1 until the end  of week 13 (months 1 through 3). During this time, your baby will begin to develop inside you. At 6-8 weeks, the eyes and face are formed, and the heartbeat can be seen on ultrasound. At the end of 12 weeks, all the baby's organs are formed. Prenatal care is all the medical care you receive before the birth of your baby. Make sure you get good prenatal care and follow all of your doctor's instructions. Follow these instructions at home: Medicines  Take over-the-counter and prescription medicines only as told by your doctor. Some medicines are safe and some medicines are not safe during pregnancy.  Take a prenatal vitamin that contains at least 600 micrograms (mcg) of folic acid.  If you have trouble pooping (constipation), take medicine that will make your stool soft (stool softener) if your doctor approves. Eating and drinking  Eat regular, healthy meals.  Your doctor will tell you the amount of weight gain that is right for you.  Avoid raw meat and uncooked cheese.  If you feel sick to your stomach (nauseous) or throw up (vomit): ? Eat 4 or 5 small meals a day instead of 3 large meals. ? Try eating a few soda crackers. ? Drink liquids between meals instead of during meals.  To prevent constipation: ? Eat foods that are high in fiber, like fresh fruits and vegetables, whole grains, and beans. ? Drink enough fluids to keep your pee (urine) clear or pale yellow. Activity  Exercise only as told by your doctor. Stop exercising if you have  cramps or pain in your lower belly (abdomen) or low back.  Do not exercise if it is too hot, too humid, or if you are in a place of great height (high altitude).  Try to avoid standing for long periods of time. Move your legs often if you must stand in one place for a long time.  Avoid heavy lifting.  Wear low-heeled shoes. Sit and stand up straight.  You can have sex unless your doctor tells you not to. Relieving pain and discomfort  Wear a good support bra if your breasts are sore.  Take warm water baths (sitz baths) to soothe pain or discomfort caused by hemorrhoids. Use hemorrhoid cream if your doctor says it is okay.  Rest with your legs raised if you have leg cramps or low back pain.  If you have puffy, bulging veins (varicose veins) in your legs: ? Wear support hose or compression stockings as told by your doctor. ? Raise (elevate) your feet for 15 minutes, 3-4 times a day. ? Limit salt in your food. Prenatal care  Schedule your prenatal visits by the twelfth week of pregnancy.  Write down your questions. Take them to your prenatal visits.  Keep all your prenatal visits as told by your doctor. This is important. Safety  Wear your seat belt at all times when driving.  Make a list of emergency phone numbers. The list should include numbers for family, friends, the hospital, and police and fire departments. General instructions  Ask your doctor for a referral to a local prenatal class. Begin classes no later than at the start of month 6 of your pregnancy.  Ask for help if you need counseling or if you need help with nutrition. Your doctor can give you advice or tell you where to go for help.  Do not use hot tubs, steam rooms, or saunas.  Do not douche or use tampons or scented sanitary pads.  Do not cross  your legs for long periods of time.  Avoid all herbs and alcohol. Avoid drugs that are not approved by your doctor.  Do not use any tobacco products, including  cigarettes, chewing tobacco, and electronic cigarettes. If you need help quitting, ask your doctor. You may get counseling or other support to help you quit.  Avoid cat litter boxes and soil used by cats. These carry germs that can cause birth defects in the baby and can cause a loss of your baby (miscarriage) or stillbirth.  Visit your dentist. At home, brush your teeth with a soft toothbrush. Be gentle when you floss. Contact a doctor if:  You are dizzy.  You have mild cramps or pressure in your lower belly.  You have a nagging pain in your belly area.  You continue to feel sick to your stomach, you throw up, or you have watery poop (diarrhea).  You have a bad smelling fluid coming from your vagina.  You have pain when you pee (urinate).  You have increased puffiness (swelling) in your face, hands, legs, or ankles. Get help right away if:  You have a fever.  You are leaking fluid from your vagina.  You have spotting or bleeding from your vagina.  You have very bad belly cramping or pain.  You gain or lose weight rapidly.  You throw up blood. It may look like coffee grounds.  You are around people who have [place] measles, fifth disease, or chickenpox.  You have a very bad headache.  You have shortness of breath.  You have any kind of trauma, such as from a fall or a car accident. Summary  The first trimester of pregnancy is from week 1 until the end of week 13 (months 1 through 3).  To take care of yourself and your unborn baby, you will need to eat healthy meals, take medicines only if your doctor tells you to do so, and do activities that are safe for you and your baby.  Keep all follow-up visits as told by your doctor. This is important as your doctor will have to ensure that your baby is healthy and growing well. This information is not intended to replace advice given to you by your health care provider. Make sure you discuss any questions you have with your  health care provider. Document Released: 09/04/2007 Document Revised: 03/26/2016 Document Reviewed: 03/26/2016 Elsevier Interactive Patient Education  2017 [employer].

## 2017-05-02 ENCOUNTER — Telehealth: Payer: Self-pay | Admitting: *Deleted

## 2017-05-02 ENCOUNTER — Encounter: Payer: Self-pay | Admitting: Emergency Medicine

## 2017-05-02 ENCOUNTER — Emergency Department
Admission: EM | Admit: 2017-05-02 | Discharge: 2017-05-02 | Disposition: A | Discharge status: Home / Self Care | Payer: BLUE CROSS/BLUE SHIELD | Attending: Student in an Organized Health Care Education/Training Program | Admitting: Student in an Organized Health Care Education/Training Program

## 2017-05-02 ENCOUNTER — Emergency Department: Payer: BLUE CROSS/BLUE SHIELD

## 2017-05-02 DIAGNOSIS — R079 Chest pain, unspecified: Secondary | ICD-10-CM | POA: Diagnosis not present

## 2017-05-02 DIAGNOSIS — Z79899 Other long term (current) drug therapy: Secondary | ICD-10-CM | POA: Diagnosis not present

## 2017-05-02 DIAGNOSIS — R109 Unspecified abdominal pain: Secondary | ICD-10-CM

## 2017-05-02 DIAGNOSIS — O3680X0 Pregnancy with inconclusive fetal viability, not applicable or unspecified: Secondary | ICD-10-CM

## 2017-05-02 DIAGNOSIS — Z3A08 8 weeks gestation of pregnancy: Secondary | ICD-10-CM | POA: Insufficient documentation

## 2017-05-02 DIAGNOSIS — O200 Threatened abortion: Secondary | ICD-10-CM

## 2017-05-02 DIAGNOSIS — N926 Irregular menstruation, unspecified: Secondary | ICD-10-CM

## 2017-05-02 DIAGNOSIS — O26899 Other specified pregnancy related conditions, unspecified trimester: Secondary | ICD-10-CM

## 2017-05-02 DIAGNOSIS — M25512 Pain in left shoulder: Secondary | ICD-10-CM | POA: Insufficient documentation

## 2017-05-02 DIAGNOSIS — Z3400 Encounter for supervision of normal first pregnancy, unspecified trimester: Secondary | ICD-10-CM | POA: Diagnosis not present

## 2017-05-02 DIAGNOSIS — O26851 Spotting complicating pregnancy, first trimester: Secondary | ICD-10-CM

## 2017-05-02 LAB — URINALYSIS, COMPLETE (UACMP) WITH MICROSCOPIC
BILIRUBIN URINE: NEGATIVE
Bacteria, UA: NONE SEEN
Glucose, UA: NEGATIVE mg/dL
Hgb urine dipstick: NEGATIVE
Ketones, ur: NEGATIVE mg/dL
LEUKOCYTES UA: NEGATIVE
Nitrite: NEGATIVE
PH: 6 (ref 5.0–8.0)
Protein, ur: NEGATIVE mg/dL
SPECIFIC GRAVITY, URINE: 1.023 (ref 1.005–1.030)

## 2017-05-02 LAB — CBC
HEMATOCRIT: 40.9 % (ref 35.0–47.0)
HEMOGLOBIN: 13.8 g/dL (ref 12.0–16.0)
MCH: 30.2 pg (ref 26.0–34.0)
MCHC: 33.7 g/dL (ref 32.0–36.0)
MCV: 89.7 fL (ref 80.0–100.0)
Platelets: 311 10*3/uL (ref 150–440)
RBC: 4.56 MIL/uL (ref 3.80–5.20)
RDW: 13.2 % (ref 11.5–14.5)
WBC: 10.1 10*3/uL (ref 3.6–11.0)

## 2017-05-02 LAB — BASIC METABOLIC PANEL
ANION GAP: 10 (ref 5–15)
BUN: 12 mg/dL (ref 6–20)
CO2: 24 mmol/L (ref 22–32)
Calcium: 9.2 mg/dL (ref 8.9–10.3)
Chloride: 103 mmol/L (ref 101–111)
Creatinine, Ser: 0.68 mg/dL (ref 0.44–1.00)
Glucose, Bld: 95 mg/dL (ref 65–99)
POTASSIUM: 3.8 mmol/L (ref 3.5–5.1)
SODIUM: 137 mmol/L (ref 135–145)

## 2017-05-02 LAB — HCG, QUANTITATIVE, PREGNANCY: HCG, BETA CHAIN, QUANT, S: 92107 m[IU]/mL — AB (ref <5)

## 2017-05-02 LAB — TROPONIN I: Troponin I: <0.03 ng/mL (ref <0.03)

## 2017-05-02 LAB — ABO/RH: ABO/RH(D): A POS

## 2017-05-02 LAB — POCT PREGNANCY, URINE: Preg Test, Ur: POSITIVE — AB

## 2017-05-02 NOTE — ED Triage Notes (Signed)
Pt reports is [redacted] weeks pregnant has been having some premature contractions, chest pain and shoulder pain. Pt reports was advised by her OB to come to the ED. Pt also reports some intermittent spotting as well

## 2017-05-02 NOTE — Telephone Encounter (Signed)
Patient left message stating she needed to be seen today for her cardiac consult. Patient states she was having a hard time sleeping last night. I called [facility] to schedule her an appt for today. Gabriel Cirri the scheduler at [facility] asked if the patient was having ACTIVE chest pain if so she would have to go to the ED.  Toni Mccormick called the patient and she stated she was having active chest pain on and off since yesterday  . We told her that [facility]  recommended her to go to the ED for that and we can schedule her consult afterwards. Patient states she was going to the ED today. Thank you

## 2017-05-02 NOTE — ED Provider Notes (Signed)
[facility] Emergency Department Provider Note  ____________________________________________   First MD Initiated Contact with Patient 05/02/17 1215     (approximate)  I have reviewed the triage vital signs and the nursing notes.   HISTORY  Chief Complaint Contractions; Chest Pain; and Shoulder Pain   HPI Toni Mccormick is a 27 y.o. female with a history of gestational hypertension during her first pregnancy who is presenting to the emergency department today with 10 days of intermittent right-sided as well as left shoulder pain that comes intermittently.  She says that the pain can come at rest or with activity.  She is also had increasing shortness of breath over this period of time.  Pain is worse at night.  Says that the pain is sharp to the left shoulder and then more of a pressure type pain to the right side of the chest.  Patient denies any vomiting but does admit to nausea.  Also said that she started spotting today after having the ultrasound.  However, she says that she has been putting previously and [facility] and is scheduled for an appointment this Friday for a viability scan.  Says that she also has a baseline vaginal discharge that is unchanged.  No history of STDs.  No history of PE.  Does not report cough.  Patient's mother is a nurse who says that she hooked her up to a cardiac monitor and found the patient was having intermittent PACs.  The patient does admit to feeling intermittent palpitations as well.  She says that she was going to see a cardiologist as an outpatient but instead because of the worsening of the symptoms today the patient was recommended to come to the emergency department.  Patient is pain-free at this time.  Past Medical History:  Diagnosis Date  . Chicken pox   . Gestational hypertension   . Post partum depression     Patient Active Problem List   Diagnosis Date Noted  . Fatigue  11/01/2014  . Anxiety and depression 08/22/2014  . GERD (gastroesophageal reflux disease) 08/22/2014    Past Surgical History:  Procedure Laterality Date  . TONSILLECTOMY AND ADENOIDECTOMY Bilateral 1992    Prior to Admission medications   Medication Sig Start Date End Date Taking? Authorizing Provider  Prenatal Vit-Fe Fumarate-FA (PRENATAL MULTIVITAMIN) TABS tablet Take 1 tablet by mouth daily at 12 noon.   Yes [provider]  buPROPion (WELLBUTRIN XL) 150 MG 24 hr tablet TAKE 1 BY MOUTH DAILY Patient not taking: Reported on 09/24/2016 08/25/15   Coral Spikes, DO  pantoprazole (PROTONIX) 40 MG tablet TAKE 1 BY MOUTH DAILY -Lander CNM Patient not taking: Reported on 05/01/2017 11/19/16   Joylene Igo, CNM    Allergies Patient has no known allergies.  Family History  Problem Relation Age of Onset  . Hyperlipidemia Father   . Hypertension Father   . Hyperlipidemia Maternal Grandmother   . Cancer Maternal Grandfather        lung  . Diabetes Paternal Grandmother   . Arthritis Paternal Grandfather   . Cancer Paternal Grandfather        lung    Social History Social History   Tobacco Use  . Smoking status: Never Smoker  . Smokeless tobacco: Never Used  Substance Use Topics  . Alcohol use: Yes    Comment: occasionally  . Drug use: No    Review of Systems  Constitutional: No fever/chills Eyes:  No visual changes. ENT: No sore throat. Cardiovascular: as above Respiratory: as above Gastrointestinal: No abdominal pain.  no vomiting.  No diarrhea.  No constipation. Genitourinary: Negative for dysuria. Musculoskeletal: Negative for back pain. Skin: Negative for rash. Neurological: Negative for headaches, focal weakness or numbness.   ____________________________________________   PHYSICAL EXAM:  VITAL SIGNS: ED Triage Vitals  Enc Vitals Group     BP 05/02/17 0923 137/84     Pulse Rate 05/02/17 0923 93     Resp 05/02/17 0923 20     Temp  05/02/17 0923 98.4 F (36.9 C)     Temp Source 05/02/17 0923 Oral     SpO2 05/02/17 0923 100 %     Weight 05/02/17 0924 170 lb (77.1 kg)     Height 05/02/17 0924 5\' 5"  (1.651 m)     Head Circumference --      Peak Flow --      Pain Score 05/02/17 0942 3     Pain Loc --      Pain Edu? --      Excl. in [place]? --     Constitutional: Alert and oriented. Well appearing and in no acute distress. Eyes: Conjunctivae are normal.  Head: Atraumatic. Nose: No congestion/rhinnorhea. Mouth/Throat: Mucous membranes are moist.  Neck: No stridor.   Cardiovascular: Normal rate, regular rhythm. Grossly normal heart sounds.  Chest pain is not reproducible to palpation. Respiratory: Normal respiratory effort.  No retractions. Lungs CTAB. Gastrointestinal: Soft and nontender. No distention.  Musculoskeletal: No lower extremity tenderness nor edema.  No joint effusions. Neurologic:  Normal speech and language. No gross focal neurologic deficits are appreciated. Skin:  Skin is warm, dry and intact. No rash noted. Psychiatric: Mood and affect are normal. Speech and behavior are normal.  ____________________________________________   LABS (all labs ordered are listed, but only abnormal results are displayed)  Labs Reviewed  HCG, QUANTITATIVE, PREGNANCY - Abnormal; Notable for the following components:      Result Value   hCG, Beta Chain, Quant, S 92,107 (*)    All other components within normal limits  POCT PREGNANCY, URINE - Abnormal; Notable for the following components:   Preg Test, Ur POSITIVE (*)    All other components within normal limits  BASIC METABOLIC PANEL  CBC  URINALYSIS, COMPLETE (UACMP) WITH MICROSCOPIC  TROPONIN I  POC URINE PREG, ED  ABO/RH   ____________________________________________  EKG  ED ECG REPORT I, Doran Stabler, the attending physician, personally viewed and interpreted this ECG.   Date: 05/02/2017  EKG Time: 946  Rate: 87  Rhythm: normal sinus rhythm   Axis: Normal  Intervals:none  ST&T Change: No ST segment elevation or depression.  No abnormal T wave inversion.  ____________________________________________  RADIOLOGY  Ultrasound with a single IUP at 8 weeks and 1 day.  Small subchorionic hemorrhage.  Probable bilateral ovarian dermoids. ____________________________________________   PROCEDURES  Procedure(s) performed:   Procedures  Critical Care performed:   ____________________________________________   INITIAL IMPRESSION / ASSESSMENT AND PLAN / ED COURSE  Pertinent labs & imaging results that were available during my care of the patient were reviewed by me and considered in my medical decision making (see chart for details).  Differential diagnosis includes, but is not limited to, ACS, aortic dissection, pulmonary embolism, cardiac tamponade, pneumothorax, pneumonia, pericarditis, myocarditis, GI-related causes including esophagitis/gastritis, and musculoskeletal chest wall pain.   Differential diagnosis includes, but is not limited to, threatened miscarriage, incomplete miscarriage, normal bleeding from an early trimester  pregnancy, ectopic pregnancy, , blighted ovum, vaginal/cervical trauma, subchorionic hemorrhage/hematoma, etc.  As part of my medical decision making, I reviewed the following data within the [place] Notes from prior visits  Patient's chest pain is intermittent and not associated with activity.  No pleuritic pain.  Normal heart rate, normal EKG, normal oxygen saturation.  Unlikely to be pulmonary embolus.  unLikely to be infectious.  Possible musculoskeletal component plus the palpitations.  We discussed chest x-ray at this time.  However, in the context of the patient being pregnant without symptoms currently, the patient thought that the chest x-ray would be more harmful than beneficial given radiation risks.    ----------------------------------------- 3:09 PM on  05/02/2017 -----------------------------------------  Reassuring lab work.  At this time, the patient is pending ABO Rh.  Signed out to Dr. Quentin Cornwall.  ____________________________________________   FINAL CLINICAL IMPRESSION(S) / ED DIAGNOSES  Final diagnoses:  Spotting affecting pregnancy in first trimester  Cramping affecting pregnancy, antepartum  Missed menses  Encounter to determine fetal viability of pregnancy      NEW MEDICATIONS STARTED DURING THIS VISIT:  New Prescriptions   No medications on file     Note:  This document was prepared using Dragon voice recognition software and may include unintentional dictation errors.     Orbie Pyo, MD 05/02/17 [phone]

## 2017-05-02 NOTE — ED Notes (Signed)
Pt ambulatory to POV, VSS. NAD. Discharge instructions, and follow up reviewed. All questions answered.

## 2017-05-02 NOTE — Telephone Encounter (Signed)
noted 

## 2017-05-02 NOTE — ED Notes (Signed)
Reassessed pt in [place].  Denies worsening sx's, will continue to monitor.

## 2017-05-03 NOTE — Progress Notes (Signed)
GYN ENCOUNTER NOTE  Subjective:       Toni Mccormick is a 27 y.o. G2P1 female here for pregnancy confirmation.   Reports intermittent shortness of breath, irregular heart beat, lower abdominal cramping, and brownish pink spotting.   Denies difficulty breathing or respiratory distress, chest pain, vaginal bleeding, dysuria, and leg pain or swelling.   Patient mother is a Marine scientist and she presents to today's visit with EKG stripes showing possible PACs.   History significant for previous pregnancy with gestational hypertension and postpartum depression.     Gynecologic History  Patient's last menstrual period was 03/11/2017.  Estimated date of birth: 12/16/2017.   Gestational age: 21 weeks 2 days.   Obstetric History  OB History  Gravida Para Term Preterm AB Living  2         1  SAB TAB Ectopic Multiple Live Births          1    # Outcome Date GA Lbr Len/2nd Weight Sex Delivery Anes PTL Lv  2 Current           1 Gravida 04/29/14    M Vag-Spont   LIV      Past Medical History:  Diagnosis Date  . Chicken pox   . Gestational hypertension   . Post partum depression     Past Surgical History:  Procedure Laterality Date  . TONSILLECTOMY AND ADENOIDECTOMY Bilateral 1992    Current Outpatient Medications on File Prior to Visit  Medication Sig Dispense Refill  . buPROPion (WELLBUTRIN XL) 150 MG 24 hr tablet TAKE 1 BY MOUTH DAILY (Patient not taking: Reported on 09/24/2016) 90 tablet 3  . pantoprazole (PROTONIX) 40 MG tablet TAKE 1 BY MOUTH DAILY -MELODY SHAMBLEY CNM (Patient not taking: Reported on 05/01/2017) 90 tablet 2   No current facility-administered medications on file prior to visit.     No Known Allergies  Social History   Socioeconomic History  . Marital status: Married    Spouse name: Not on file  . Number of children: Not on file  . Years of education: Not on file  . Highest education level: Not on file  Social Needs  . Financial resource strain: Not  on file  . Food insecurity - worry: Not on file  . Food insecurity - inability: Not on file  . Transportation needs - medical: Not on file  . Transportation needs - non-medical: Not on file  Occupational History  . Not on file  Tobacco Use  . Smoking status: Never Smoker  . Smokeless tobacco: Never Used  Substance and Sexual Activity  . Alcohol use: Yes    Comment: occasionally  . Drug use: No  . Sexual activity: Yes  Other Topics Concern  . Not on file  Social History Narrative  . Not on file    Family History  Problem Relation Age of Onset  . Hyperlipidemia Father   . Hypertension Father   . Hyperlipidemia Maternal Grandmother   . Cancer Maternal Grandfather        lung  . Diabetes Paternal Grandmother   . Arthritis Paternal Grandfather   . Cancer Paternal Grandfather        lung    The following portions of the patient's history were reviewed and updated as appropriate: allergies, current medications, past family history, past medical history, past social history, past surgical history and problem list.  Review of Systems Review of Systems - Negative except as noted above.  History obtained from the  patient.  Objective:   BP 128/82   Pulse 75   Ht 5\' 5"  (1.651 m)   Wt 174 lb (78.9 kg)   LMP 03/11/2017   BMI 28.96 kg/m   General: Alert and oriented x 4, no apparent distress.   Positive UPT  Assessment:   1. Missed menses  - POCT urine pregnancy  2. Spotting affecting pregnancy in first trimester  - Ambulatory referral to Cardiology  3. Abdominal cramping affecting pregnancy   4. Irregular heartbeat  - Ambulatory referral to Cardiology  5. Shortness of breath  - Ambulatory referral to Cardiology   Plan:   Reviewed red flag symptoms and when to call.   Referral to Cardiology, see orders.   RTC x 1-2 weeks for dating/viability [place] and nurse intake or sooner if needed.    Diona Fanti, CNM [facility], [facility] [place]

## 2017-05-05 ENCOUNTER — Other Ambulatory Visit: Payer: Self-pay | Admitting: Certified Nurse Midwife

## 2017-05-05 DIAGNOSIS — N926 Irregular menstruation, unspecified: Secondary | ICD-10-CM

## 2017-05-05 DIAGNOSIS — Z3481 Encounter for supervision of other normal pregnancy, first trimester: Secondary | ICD-10-CM

## 2017-05-09 ENCOUNTER — Ambulatory Visit (INDEPENDENT_AMBULATORY_CARE_PROVIDER_SITE_OTHER): Payer: BLUE CROSS/BLUE SHIELD

## 2017-05-09 ENCOUNTER — Ambulatory Visit (INDEPENDENT_AMBULATORY_CARE_PROVIDER_SITE_OTHER): Payer: BLUE CROSS/BLUE SHIELD | Admitting: Certified Nurse Midwife

## 2017-05-09 VITALS — BP 106/71 | HR 79 | Ht 65.0 in | Wt 172.5 lb

## 2017-05-09 DIAGNOSIS — N926 Irregular menstruation, unspecified: Secondary | ICD-10-CM | POA: Diagnosis not present

## 2017-05-09 DIAGNOSIS — O219 Vomiting of pregnancy, unspecified: Secondary | ICD-10-CM

## 2017-05-09 DIAGNOSIS — Z3481 Encounter for supervision of other normal pregnancy, first trimester: Secondary | ICD-10-CM

## 2017-05-09 DIAGNOSIS — Z202 Contact with and (suspected) exposure to infections with a predominantly sexual mode of transmission: Secondary | ICD-10-CM

## 2017-05-09 NOTE — Patient Instructions (Signed)
WHAT OB PATIENTS CAN EXPECT   Confirmation of pregnancy and ultrasound ordered if medically indicated-[redacted] weeks gestation  New OB (NOB) intake with nurse and New OB (NOB) labs- [redacted] weeks gestation  New OB (NOB) physical examination with provider- 11/[redacted] weeks gestation  Flu vaccine-[redacted] weeks gestation  Anatomy scan-[redacted] weeks gestation  Glucose tolerance test, blood work to test for anemia, T-dap vaccine-[redacted] weeks gestation  Vaginal swabs/cultures-STD/Group B strep-[redacted] weeks gestation  Appointments every 4 weeks until 28 weeks  Every 2 weeks from 28 weeks until 36 weeks  Weekly visits from 36 weeks until delivery  Morning Sickness Morning sickness is when you feel sick to your stomach (nauseous) during pregnancy. You may feel sick to your stomach and throw up (vomit). You may feel sick in the morning, but you can feel this way any time of day. Some women feel very sick to their stomach and cannot stop throwing up (hyperemesis gravidarum). Follow these instructions at home:  Only take medicines as told by your doctor.  Take multivitamins as told by your doctor. Taking multivitamins before getting pregnant can stop or lessen the harshness of morning sickness.  Eat dry toast or unsalted crackers before getting out of bed.  Eat 5 to 6 small meals a day.  Eat dry and bland foods like rice and baked potatoes.  Do not drink liquids with meals. Drink between meals.  Do not eat greasy, fatty, or spicy foods.  Have someone cook for you if the smell of food causes you to feel sick or throw up.  If you feel sick to your stomach after taking prenatal vitamins, take them at night or with a snack.  Eat protein when you need a snack (nuts, yogurt, cheese).  Eat unsweetened gelatins for dessert.  Wear a bracelet used for sea sickness (acupressure wristband).  Go to a doctor that puts thin needles into certain body points (acupuncture) to improve how you feel.  Do not smoke.  Use a  humidifier to keep the air in your house free of odors.  Get lots of fresh air. Contact a doctor if:  You need medicine to feel better.  You feel dizzy or lightheaded.  You are losing weight. Get help right away if:  You feel very sick to your stomach and cannot stop throwing up.  You pass out (faint). This information is not intended to replace advice given to you by your health care provider. Make sure you discuss any questions you have with your health care provider. Document Released: 04/25/2004 Document Revised: 08/24/2015 Document Reviewed: 09/02/2012 Elsevier Interactive Patient Education  2017 [employer]. How a Baby Grows During Pregnancy Pregnancy begins when a female's sperm enters a female's egg (fertilization). This happens in one of the tubes (fallopian tubes) that connect the ovaries to the womb (uterus). The fertilized egg is called an embryo until it reaches 10 weeks. From 10 weeks until birth, it is called a fetus. The fertilized egg moves down the fallopian tube to the uterus. Then it implants into the lining of the uterus and begins to grow. The developing fetus receives oxygen and nutrients through the pregnant woman's bloodstream and the tissues that grow (placenta) to support the fetus. The placenta is the life support system for the fetus. It provides nutrition and removes waste. Learning as much as you can about your pregnancy and how your baby is developing can help you enjoy the experience. It can also make you aware of when there might be a problem  and when to ask questions. How long does a typical pregnancy last? A pregnancy usually lasts 280 days, or about 40 weeks. Pregnancy is divided into three trimesters:  First trimester: 0-13 weeks.  Second trimester: 14-27 weeks.  Third trimester: 28-40 weeks.  The day when your baby is considered ready to be born (full term) is your estimated date of delivery. How does my baby develop month by month? First  month  The fertilized egg attaches to the inside of the uterus.  Some cells will form the placenta. Others will form the fetus.  The arms, legs, brain, spinal cord, lungs, and heart begin to develop.  At the end of the first month, the heart begins to beat.  Second month  The bones, inner ear, eyelids, hands, and feet form.  The genitals develop.  By the end of 8 weeks, all major organs are developing.  Third month  All of the internal organs are forming.  Teeth develop below the gums.  Bones and muscles begin to grow. The spine can flex.  The skin is transparent.  Fingernails and toenails begin to form.  Arms and legs continue to grow longer, and hands and feet develop.  The fetus is about 3 in (7.6 cm) long.  Fourth month  The placenta is completely formed.  The external sex organs, neck, outer ear, eyebrows, eyelids, and fingernails are formed.  The fetus can hear, swallow, and move its arms and legs.  The kidneys begin to produce urine.  The skin is covered with a white waxy coating (vernix) and very fine hair (lanugo).  Fifth month  The fetus moves around more and can be felt for the first time (quickening).  The fetus starts to sleep and wake up and may begin to suck its finger.  The nails grow to the end of the fingers.  The organ in the digestive system that makes bile (gallbladder) functions and helps to digest the nutrients.  If your baby is a girl, eggs are present in her ovaries. If your baby is a boy, testicles start to move down into his scrotum.  Sixth month  The lungs are formed, but the fetus is not yet able to breathe.  The eyes open. The brain continues to develop.  Your baby has fingerprints and toe prints. Your baby's hair grows thicker.  At the end of the second trimester, the fetus is about 9 in (22.9 cm) long.  Seventh month  The fetus kicks and stretches.  The eyes are developed enough to sense changes in light.  The  hands can make a grasping motion.  The fetus responds to sound.  Eighth month  All organs and body systems are fully developed and functioning.  Bones harden and taste buds develop. The fetus may hiccup.  Certain areas of the brain are still developing. The skull remains soft.  Ninth month  The fetus gains about  lb (0.23 kg) each week.  The lungs are fully developed.  Patterns of sleep develop.  The fetus's head typically moves into a head-down position (vertex) in the uterus to prepare for birth. If the buttocks move into a vertex position instead, the baby is breech.  The fetus weighs 6-9 lbs (2.72-4.08 kg) and is 19-20 in (48.26-50.8 cm) long.  What can I do to have a healthy pregnancy and help my baby develop? Eating and Drinking  Eat a healthy diet. ? Talk with your health care provider to make sure that you are getting the  nutrients that you and your baby need. ? Visit www.BuildDNA.es to learn about creating a healthy diet.  Gain a healthy amount of weight during pregnancy as advised by your health care provider. This is usually 25-35 pounds. You may need to: ? Gain more if you were underweight before getting pregnant or if you are pregnant with more than one baby. ? Gain less if you were overweight or obese when you got pregnant.  Medicines and Vitamins  Take prenatal vitamins as directed by your health care provider. These include vitamins such as folic acid, iron, calcium, and vitamin D. They are important for healthy development.  Take medicines only as directed by your health care provider. Read labels and ask a pharmacist or your health care provider whether over-the-counter medicines, supplements, and prescription drugs are safe to take during pregnancy.  Activities  Be physically active as advised by your health care provider. Ask your health care provider to recommend activities that are safe for you to do, such as walking or swimming.  Do not  participate in strenuous or extreme sports.  Lifestyle  Do not drink alcohol.  Do not use any tobacco products, including cigarettes, chewing tobacco, or electronic cigarettes. If you need help quitting, ask your health care provider.  Do not use illegal drugs.  Safety  Avoid exposure to mercury, lead, or other heavy metals. Ask your health care provider about common sources of these heavy metals.  Avoid listeria infection during pregnancy. Follow these precautions: ? Do not eat soft cheeses or deli meats. ? Do not eat hot dogs unless they have been warmed up to the point of steaming, such as in the microwave oven. ? Do not drink unpasteurized milk.  Avoid toxoplasmosis infection during pregnancy. Follow these precautions: ? Do not change your cat's litter box, if you have a cat. Ask someone else to do this for you. ? Wear gardening gloves while working in the yard.  General Instructions  Keep all follow-up visits as directed by your health care provider. This is important. This includes prenatal care and screening tests.  Manage any chronic health conditions. Work closely with your health care provider to keep conditions, such as diabetes, under control.  How do I know if my baby is developing well? At each prenatal visit, your health care provider will do several different tests to check on your health and keep track of your baby's development. These include:  Fundal height. ? Your health care provider will measure your growing belly from top to bottom using a tape measure. ? Your health care provider will also feel your belly to determine your baby's position.  Heartbeat. ? An ultrasound in the first trimester can confirm pregnancy and show a heartbeat, depending on how far along you are. ? Your health care provider will check your baby's heart rate at every prenatal visit. ? As you get closer to your delivery date, you may have regular fetal heart rate monitoring to make  sure that your baby is not in distress.  Second trimester ultrasound. ? This ultrasound checks your baby's development. It also indicates your baby's gender.  What should I do if I have concerns about my baby's development? Always talk with your health care provider about any concerns that you may have. This information is not intended to replace advice given to you by your health care provider. Make sure you discuss any questions you have with your health care provider. Document Released: 09/04/2007 Document Revised: 08/24/2015 Document Reviewed:  08/25/2013 Elsevier Interactive Patient Education  2018 [place] Trimester of Pregnancy The first trimester of pregnancy is from week 1 until the end of week 13 (months 1 through 3). A week after a sperm fertilizes an egg, the egg will implant on the wall of the uterus. This embryo will begin to develop into a baby. Genes from you and your partner will form the baby. The female genes will determine whether the baby will be a boy or a girl. At 6-8 weeks, the eyes and face will be formed, and the heartbeat can be seen on ultrasound. At the end of 12 weeks, all the baby's organs will be formed. Now that you are pregnant, you will want to do everything you can to have a healthy baby. Two of the most important things are to get good prenatal care and to follow your health care provider's instructions. Prenatal care is all the medical care you receive before the baby's birth. This care will help prevent, find, and treat any problems during the pregnancy and childbirth. Body changes during your first trimester Your body goes through many changes during pregnancy. The changes vary from woman to woman.  You may gain or lose a couple of pounds at first.  You may feel sick to your stomach (nauseous) and you may throw up (vomit). If the vomiting is uncontrollable, call your health care provider.  You may tire easily.  You may develop headaches that can  be relieved by medicines. All medicines should be approved by your health care provider.  You may urinate more often. Painful urination may mean you have a bladder infection.  You may develop heartburn as a result of your pregnancy.  You may develop constipation because certain hormones are causing the muscles that push stool through your intestines to slow down.  You may develop hemorrhoids or swollen veins (varicose veins).  Your breasts may begin to grow larger and become tender. Your nipples may stick out more, and the tissue that surrounds them (areola) may become darker.  Your gums may bleed and may be sensitive to brushing and flossing.  Dark spots or blotches (chloasma, mask of pregnancy) may develop on your face. This will likely fade after the baby is born.  Your menstrual periods will stop.  You may have a loss of appetite.  You may develop cravings for certain kinds of food.  You may have changes in your emotions from day to day, such as being excited to be pregnant or being concerned that something may go wrong with the pregnancy and baby.  You may have more vivid and strange dreams.  You may have changes in your hair. These can include thickening of your hair, rapid growth, and changes in texture. Some women also have hair loss during or after pregnancy, or hair that feels dry or thin. Your hair will most likely return to normal after your baby is born.  What to expect at prenatal visits During a routine prenatal visit:  You will be weighed to make sure you and the baby are growing normally.  Your blood pressure will be taken.  Your abdomen will be measured to track your baby's growth.  The fetal heartbeat will be listened to between weeks 10 and 14 of your pregnancy.  Test results from any previous visits will be discussed.  Your health care provider may ask you:  How you are feeling.  If you are feeling the baby move.  If you have had any  abnormal  symptoms, such as leaking fluid, bleeding, severe headaches, or abdominal cramping.  If you are using any tobacco products, including cigarettes, chewing tobacco, and electronic cigarettes.  If you have any questions.  Other tests that may be performed during your first trimester include:  Blood tests to find your blood type and to check for the presence of any previous infections. The tests will also be used to check for low iron levels (anemia) and protein on red blood cells (Rh antibodies). Depending on your risk factors, or if you previously had diabetes during pregnancy, you may have tests to check for high blood sugar that affects pregnant women (gestational diabetes).  Urine tests to check for infections, diabetes, or protein in the urine.  An ultrasound to confirm the proper growth and development of the baby.  Fetal screens for spinal cord problems (spina bifida) and Down syndrome.  HIV (human immunodeficiency virus) testing. Routine prenatal testing includes screening for HIV, unless you choose not to have this test.  You may need other tests to make sure you and the baby are doing well.  Follow these instructions at home: Medicines  Follow your health care provider's instructions regarding medicine use. Specific medicines may be either safe or unsafe to take during pregnancy.  Take a prenatal vitamin that contains at least 600 micrograms (mcg) of folic acid.  If you develop constipation, try taking a stool softener if your health care provider approves. Eating and drinking  Eat a balanced diet that includes fresh fruits and vegetables, whole grains, good sources of protein such as meat, eggs, or tofu, and low-fat dairy. Your health care provider will help you determine the amount of weight gain that is right for you.  Avoid raw meat and uncooked cheese. These carry germs that can cause birth defects in the baby.  Eating four or five small meals rather than three large  meals a day may help relieve nausea and vomiting. If you start to feel nauseous, eating a few soda crackers can be helpful. Drinking liquids between meals, instead of during meals, also seems to help ease nausea and vomiting.  Limit foods that are high in fat and processed sugars, such as fried and sweet foods.  To prevent constipation: ? Eat foods that are high in fiber, such as fresh fruits and vegetables, whole grains, and beans. ? Drink enough fluid to keep your urine clear or pale yellow. Activity  Exercise only as directed by your health care provider. Most women can continue their usual exercise routine during pregnancy. Try to exercise for 30 minutes at least 5 days a week. Exercising will help you: ? Control your weight. ? Stay in shape. ? Be prepared for labor and delivery.  Experiencing pain or cramping in the lower abdomen or lower back is a good sign that you should stop exercising. Check with your health care provider before continuing with normal exercises.  Try to avoid standing for long periods of time. Move your legs often if you must stand in one place for a long time.  Avoid heavy lifting.  Wear low-heeled shoes and practice good posture.  You may continue to have sex unless your health care provider tells you not to. Relieving pain and discomfort  Wear a good support bra to relieve breast tenderness.  Take warm sitz baths to soothe any pain or discomfort caused by hemorrhoids. Use hemorrhoid cream if your health care provider approves.  Rest with your legs elevated if you have leg   cramps or low back pain.  If you develop varicose veins in your legs, wear support hose. Elevate your feet for 15 minutes, 3-4 times a day. Limit salt in your diet. Prenatal care  Schedule your prenatal visits by the twelfth week of pregnancy. They are usually scheduled monthly at first, then more often in the last 2 months before delivery.  Write down your questions. Take them to  your prenatal visits.  Keep all your prenatal visits as told by your health care provider. This is important. Safety  Wear your seat belt at all times when driving.  Make a list of emergency phone numbers, including numbers for family, friends, the hospital, and police and fire departments. General instructions  Ask your health care provider for a referral to a local prenatal education class. Begin classes no later than the beginning of month 6 of your pregnancy.  Ask for help if you have counseling or nutritional needs during pregnancy. Your health care provider can offer advice or refer you to specialists for help with various needs.  Do not use hot tubs, steam rooms, or saunas.  Do not douche or use tampons or scented sanitary pads.  Do not cross your legs for long periods of time.  Avoid cat litter boxes and soil used by cats. These carry germs that can cause birth defects in the baby and possibly loss of the fetus by miscarriage or stillbirth.  Avoid all smoking, herbs, alcohol, and medicines not prescribed by your health care provider. Chemicals in these products affect the formation and growth of the baby.  Do not use any products that contain nicotine or tobacco, such as cigarettes and e-cigarettes. If you need help quitting, ask your health care provider. You may receive counseling support and other resources to help you quit.  Schedule a dentist appointment. At home, brush your teeth with a soft toothbrush and be gentle when you floss. Contact a health care provider if:  You have dizziness.  You have mild pelvic cramps, pelvic pressure, or nagging pain in the abdominal area.  You have persistent nausea, vomiting, or diarrhea.  You have a bad smelling vaginal discharge.  You have pain when you urinate.  You notice increased swelling in your face, hands, legs, or ankles.  You are exposed to fifth disease or chickenpox.  You are exposed to German measles (rubella)  and have never had it. Get help right away if:  You have a fever.  You are leaking fluid from your vagina.  You have spotting or bleeding from your vagina.  You have severe abdominal cramping or pain.  You have rapid weight gain or loss.  You vomit blood or material that looks like coffee grounds.  You develop a severe headache.  You have shortness of breath.  You have any kind of trauma, such as from a fall or a car accident. Summary  The first trimester of pregnancy is from week 1 until the end of week 13 (months 1 through 3).  Your body goes through many changes during pregnancy. The changes vary from woman to woman.  You will have routine prenatal visits. During those visits, your health care provider will examine you, discuss any test results you may have, and talk with you about how you are feeling. This information is not intended to replace advice given to you by your health care provider. Make sure you discuss any questions you have with your health care provider. Document Released: 03/12/2001 Document Revised: 02/28/2016 Document   Reviewed: 02/28/2016 Elsevier Interactive Patient Education  2018 [employer]. Commonly Asked Questions During Pregnancy  Cats: A parasite can be excreted in cat feces.  To avoid exposure you need to have another person empty the little box.  If you must empty the litter box you will need to wear gloves.  Wash your hands after handling your cat.  This parasite can also be found in raw or undercooked meat so this should also be avoided.  Colds, Sore Throats, Flu: Please check your medication sheet to see what you can take for symptoms.  If your symptoms are unrelieved by these medications please call the office.  Dental Work: Most any dental work [employer] recommends is permitted.  X-rays should only be taken during the first trimester if absolutely necessary.  Your abdomen should be shielded with a lead apron during all x-rays.  Please  notify your provider prior to receiving any x-rays.  Novocaine is fine; gas is not recommended.  If your dentist requires a note from [place] prior to dental work please call the office and we will provide one for you.  Exercise: Exercise is an important part of staying healthy during your pregnancy.  You may continue most exercises you were accustomed to prior to pregnancy.  Later in your pregnancy you will most likely notice you have difficulty with activities requiring balance like riding a bicycle.  It is important that you listen to your body and avoid activities that put you at a higher risk of falling.  Adequate rest and staying well hydrated are a must!  If you have questions about the safety of specific activities ask your provider.    Exposure to Children with illness: Try to avoid obvious exposure; report any symptoms to [place] when noted,  If you have chicken pos, red measles or mumps, you should be immune to these diseases.   Please do not take any vaccines while pregnant unless you have checked with your OB provider.  Fetal Movement: After 28 weeks we recommend you do "kick counts" twice daily.  Lie or sit down in a calm quiet environment and count your baby movements "kicks".  You should feel your baby at least 10 times per hour.  If you have not felt 10 kicks within the first hour get up, walk around and have something sweet to eat or drink then repeat for an additional hour.  If count remains less than 10 per hour notify your provider.  Fumigating: Follow your pest control agent's advice as to how long to stay out of your home.  Ventilate the area well before re-entering.  Hemorrhoids:   Most over-the-counter preparations can be used during pregnancy.  Check your medication to see what is safe to use.  It is important to use a stool softener or fiber in your diet and to drink lots of liquids.  If hemorrhoids seem to be getting worse please call the office.   Hot Tubs:  Hot tubs Jacuzzis and  saunas are not recommended while pregnant.  These increase your internal body temperature and should be avoided.  Intercourse:  Sexual intercourse is safe during pregnancy as long as you are comfortable, unless otherwise advised by your provider.  Spotting may occur after intercourse; report any bright red bleeding that is heavier than spotting.  Labor:  If you know that you are in labor, please go to the hospital.  If you are unsure, please call the office and let us help you decide what to  do.  Lifting, straining, etc:  If your job requires heavy lifting or straining please check with your provider for any limitations.  Generally, you should not lift items heavier than that you can lift simply with your hands and arms (no back muscles)  Painting:  Paint fumes do not harm your pregnancy, but may make you ill and should be avoided if possible.  Latex or water based paints have less odor than oils.  Use adequate ventilation while painting.  Permanents & Hair Color:  Chemicals in hair dyes are not recommended as they cause increase hair dryness which can increase hair loss during pregnancy.  " Highlighting" and permanents are allowed.  Dye may be absorbed differently and permanents may not hold as well during pregnancy.  Sunbathing:  Use a sunscreen, as skin burns easily during pregnancy.  Drink plenty of fluids; avoid over heating.  Tanning Beds:  Because their possible side effects are still unknown, tanning beds are not recommended.  Ultrasound Scans:  Routine ultrasounds are performed at approximately 20 weeks.  You will be able to see your baby's general anatomy an if you would like to know the gender this can usually be determined as well.  If it is questionable when you conceived you may also receive an ultrasound early in your pregnancy for dating purposes.  Otherwise ultrasound exams are not routinely performed unless there is a medical necessity.  Although you can request a scan we ask that  you pay for it when conducted because insurance does not cover " patient request" scans.  Work: If your pregnancy proceeds without complications you may work until your due date, unless your physician or employer advises otherwise.  Round Ligament Pain/Pelvic Discomfort:  Sharp, shooting pains not associated with bleeding are fairly common, usually occurring in the second trimester of pregnancy.  They tend to be worse when standing up or when you remain standing for long periods of time.  These are the result of pressure of certain pelvic ligaments called "round ligaments".  Rest, Tylenol and heat seem to be the most effective relief.  As the womb and fetus grow, they rise out of the pelvis and the discomfort improves.  Please notify the office if your pain seems different than that described.  It may represent a more serious condition.  Common Medications Safe in Pregnancy  Acne:      Constipation:  Benzoyl Peroxide     Colace  Clindamycin      Dulcolax Suppository  Topica Erythromycin     Fibercon  Salicylic Acid      Metamucil         Miralax AVOID:        Senakot   Accutane    Cough:  Retin-A       Cough Drops  Tetracycline      Phenergan w/ Codeine if Rx  Minocycline      Robitussin (Plain & DM)  Antibiotics:     Crabs/Lice:  Ceclor       RID  Cephalosporins    AVOID:  E-Mycins      Kwell  Keflex  Macrobid/Macrodantin   Diarrhea:  Penicillin      Kao-Pectate  Zithromax      Imodium AD         PUSH FLUIDS AVOID:       Cipro     Fever:  Tetracycline      Tylenol (Regular or Extra  Minocycline       Strength)  Levaquin      Extra Strength-Do not          Exceed 8 tabs/24 hrs Caffeine:        <200mg/day (equiv. To 1 cup of coffee or  approx. 3 12 oz sodas)         Gas: Cold/Hayfever:       Gas-X  Benadryl      Mylicon  Claritin       Phazyme  **Claritin-D        Chlor-Trimeton    Headaches:  Dimetapp      ASA-Free Excedrin  Drixoral-Non-Drowsy     Cold Compress  Mucinex  (Guaifenasin)     Tylenol (Regular or Extra  Sudafed/Sudafed-12 Hour     Strength)  **Sudafed PE Pseudoephedrine   Tylenol Cold & Sinus     Vicks Vapor Rub  Zyrtec  **AVOID if Problems With Blood Pressure         Heartburn: Avoid lying down for at least 1 hour after meals  Aciphex      Maalox     Rash:  Milk of Magnesia     Benadryl    Mylanta       1% Hydrocortisone Cream  Pepcid  Pepcid Complete   Sleep Aids:  Prevacid      Ambien   Prilosec       Benadryl  Rolaids       Chamomile Tea  Tums (Limit 4/day)     Unisom  Zantac       Tylenol PM         Warm milk-add vanilla or  Hemorrhoids:       Sugar for taste  Anusol/Anusol H.C.  (RX: Analapram 2.5%)  Sugar Substitutes:  Hydrocortisone OTC     Ok in moderation  Preparation H      Tucks        Vaseline lotion applied to tissue with wiping    Herpes:     Throat:  Acyclovir      Oragel  Famvir  Valtrex     Vaccines:         Flu Shot Leg Cramps:       *Gardasil  Benadryl      Hepatitis A         Hepatitis B Nasal Spray:       Pneumovax  Saline Nasal Spray     Polio Booster         Tetanus Nausea:       Tuberculosis test or PPD  Vitamin B6 25 mg TID   AVOID:    Dramamine      *Gardasil  Emetrol       Live Poliovirus  Ginger Root 250 mg QID    MMR (measles, mumps &  High Complex Carbs @ Bedtime    rebella)  Sea Bands-Accupressure    Varicella (Chickenpox)  Unisom 1/2 tab TID     *No known complications           If received before Pain:         Known pregnancy;   Darvocet       Resume series after  Lortab        Delivery  Percocet    Yeast:   Tramadol      Femstat  Tylenol 3      Gyne-lotrimin  Ultram       Monistat  Vicodin           MISC:           All Sunscreens           Hair Coloring/highlights          Insect Repellant's          (Including DEET)         Mystic Tans

## 2017-05-09 NOTE — Progress Notes (Signed)
Toni Mccormick presents for NOB nurse interview visit. Pregnancy confirmation done _1/31/2019 with JML__.  G2- .  P1001- .  Dating scan today shows [facility] at 8 3/7.  Pregnancy education material explained and given. __0_ cats in the home. NOB labs ordered. HIV labs and Drug screen were explained and ordered. PNV encouraged. Genetic screening options discussed. Genetic testing: Unsure Pt may discuss with provider. .  Pt seen in ED on 05/02/2017 for vaginal bleeding- small [facility] noted. Per pt dating scan today revealed [facility] not visible. Advised to monitor bleeding. Bleeding precautions reviewed. Mild nausea noted- no meds needed at this time.Last pap 05/2015 wnl. Flu vaccine needed. Needs tdap at 28 weeks. Pt. To follow up with provider in _4_ weeks for NOB physical.  All questions answered.

## 2017-05-10 LAB — URINALYSIS, ROUTINE W REFLEX MICROSCOPIC
BILIRUBIN UA: NEGATIVE
Glucose, UA: NEGATIVE
KETONES UA: NEGATIVE
NITRITE UA: NEGATIVE
PH UA: 6.5 (ref 5.0–7.5)
Protein, UA: NEGATIVE
RBC UA: NEGATIVE
SPEC GRAV UA: 1.008 (ref 1.005–1.030)
UUROB: 0.2 mg/dL (ref 0.2–1.0)

## 2017-05-10 LAB — CBC WITH DIFFERENTIAL/PLATELET
Basophils Absolute: 0 10*3/uL (ref 0.0–0.2)
Basos: 0 %
EOS (ABSOLUTE): 0.1 10*3/uL (ref 0.0–0.4)
EOS: 1 %
HEMATOCRIT: 40.2 % (ref 34.0–46.6)
HEMOGLOBIN: 13 g/dL (ref 11.1–15.9)
Immature Grans (Abs): 0 10*3/uL (ref 0.0–0.1)
Immature Granulocytes: 0 %
LYMPHS ABS: 2.1 10*3/uL (ref 0.7–3.1)
Lymphs: 22 %
MCH: 29.8 pg (ref 26.6–33.0)
MCHC: 32.3 g/dL (ref 31.5–35.7)
MCV: 92 fL (ref 79–97)
MONOCYTES: 8 %
MONOS ABS: 0.8 10*3/uL (ref 0.1–0.9)
NEUTROS ABS: 6.7 10*3/uL (ref 1.4–7.0)
Neutrophils: 69 %
Platelets: 318 10*3/uL (ref 150–379)
RBC: 4.36 x10E6/uL (ref 3.77–5.28)
RDW: 13.1 % (ref 12.3–15.4)
WBC: 9.8 10*3/uL (ref 3.4–10.8)

## 2017-05-10 LAB — DRUG PROFILE, UR, 9 DRUGS (LABCORP)
Amphetamines, Urine: NEGATIVE ng/mL
BARBITURATE QUANT UR: NEGATIVE ng/mL
Benzodiazepine Quant, Ur: NEGATIVE ng/mL
COCAINE (METAB.): NEGATIVE ng/mL
Cannabinoid Quant, Ur: NEGATIVE ng/mL
METHADONE SCREEN, URINE: NEGATIVE ng/mL
OPIATE QUANT UR: NEGATIVE ng/mL
PCP QUANT UR: NEGATIVE ng/mL
PROPOXYPHENE: NEGATIVE ng/mL

## 2017-05-10 LAB — MICROSCOPIC EXAMINATION: CASTS: NONE SEEN /LPF

## 2017-05-10 LAB — NICOTINE SCREEN, URINE: Cotinine Ql Scrn, Ur: NEGATIVE ng/mL

## 2017-05-10 LAB — HIV ANTIBODY (ROUTINE TESTING W REFLEX): HIV Screen 4th Generation wRfx: NONREACTIVE

## 2017-05-10 LAB — VARICELLA ZOSTER ANTIBODY, IGG: VARICELLA: 2843 {index} (ref >165)

## 2017-05-10 LAB — RUBELLA SCREEN: Rubella Antibodies, IGG: 2.96 index (ref >0.99)

## 2017-05-10 LAB — RPR: RPR Ser Ql: NONREACTIVE

## 2017-05-10 LAB — HEPATITIS B SURFACE ANTIGEN: HEP B S AG: NEGATIVE

## 2017-05-10 LAB — ANTIBODY SCREEN: ANTIBODY SCREEN: NEGATIVE

## 2017-05-11 LAB — URINE CULTURE, OB REFLEX

## 2017-05-11 LAB — CULTURE, OB URINE

## 2017-05-13 LAB — GC/CHLAMYDIA PROBE AMP
Chlamydia trachomatis, NAA: NEGATIVE
Neisseria gonorrhoeae by PCR: NEGATIVE

## 2017-05-13 NOTE — Progress Notes (Signed)
I have reviewed the record and concur with patient management and plan.    Devin Ganaway Michelle Yasira Engelson, CNM [facility] 

## 2017-05-20 ENCOUNTER — Ambulatory Visit: Payer: BLUE CROSS/BLUE SHIELD | Admitting: Cardiovascular Disease

## 2017-05-20 ENCOUNTER — Encounter: Payer: Self-pay | Admitting: Certified Nurse Midwife

## 2017-05-20 ENCOUNTER — Encounter: Payer: Self-pay | Admitting: Cardiovascular Disease

## 2017-05-20 VITALS — BP 110/68 | HR 79 | Ht 65.0 in | Wt 175.0 lb

## 2017-05-20 DIAGNOSIS — R002 Palpitations: Secondary | ICD-10-CM | POA: Diagnosis not present

## 2017-05-20 DIAGNOSIS — R0602 Shortness of breath: Secondary | ICD-10-CM

## 2017-05-20 NOTE — Patient Instructions (Signed)
Medication Instructions:  Your physician recommends that you continue on your current medications as directed. Please refer to the Current Medication list given to you today.   Labwork: none  Testing/Procedures: Your physician has requested that you have an echocardiogram. Echocardiography is a painless test that uses sound waves to create images of your heart. It provides your doctor with information about the size and shape of your heart and how well your heart's chambers and valves are working. This procedure takes approximately one hour. There are no restrictions for this procedure.   Your physician has recommended that you wear a 48 hour holter monitor. Holter monitors are medical devices that record the heart's electrical activity. Doctors most often use these monitors to diagnose arrhythmias. Arrhythmias are problems with the speed or rhythm of the heartbeat. The monitor is a small, portable device. You can wear one while you do your normal daily activities. This is usually used to diagnose what is causing palpitations/syncope (passing out).    Follow-Up: Your physician recommends that you schedule a follow-up appointment in: as needed.   Echocardiogram An echocardiogram, or echocardiography, uses sound waves (ultrasound) to produce an image of your heart. The echocardiogram is simple, painless, obtained within a short period of time, and offers valuable information to your health care provider. The images from an echocardiogram can provide information such as:  Evidence of coronary artery disease (CAD).  Heart size.  Heart muscle function.  Heart valve function.  Aneurysm detection.  Evidence of a past heart attack.  Fluid buildup around the heart.  Heart muscle thickening.  Assess heart valve function.  Tell a health care provider about:  Any allergies you have.  All medicines you are taking, including vitamins, herbs, eye drops, creams, and over-the-counter  medicines.  Any problems you or family members have had with anesthetic medicines.  Any blood disorders you have.  Any surgeries you have had.  Any medical conditions you have.  Whether you are pregnant or may be pregnant. What happens before the procedure? No special preparation is needed. Eat and drink normally. What happens during the procedure?  In order to produce an image of your heart, gel will be applied to your chest and a wand-like tool (transducer) will be moved over your chest. The gel will help transmit the sound waves from the transducer. The sound waves will harmlessly bounce off your heart to allow the heart images to be captured in real-time motion. These images will then be recorded.  You may need an IV to receive a medicine that improves the quality of the pictures. What happens after the procedure? You may return to your normal schedule including diet, activities, and medicines, unless your health care provider tells you otherwise. This information is not intended to replace advice given to you by your health care provider. Make sure you discuss any questions you have with your health care provider. Document Released: 03/15/2000 Document Revised: 11/04/2015 Document Reviewed: 11/23/2012 Elsevier Interactive Patient Education  2017 [place].     Holter Monitoring A Holter monitor is a small device that is used to detect abnormal heart rhythms. It clips to your clothing and is connected by wires to flat, sticky disks (electrodes) that attach to your chest. It is worn continuously for 24-48 hours. Follow these instructions at home:  Wear your Holter monitor at all times, even while exercising and sleeping, for as long as directed by your health care provider.  Make sure that the Holter monitor is safely  clipped to your clothing or close to your body as recommended by your health care provider.  Do not get the monitor or wires wet.  Do not put body lotion or  moisturizer on your chest.  Keep your skin clean.  Keep a diary of your daily activities, such as walking and doing chores. If you feel that your heartbeat is abnormal or that your heart is fluttering or skipping a beat: ? Record what you are doing when it happens. ? Record what time of day the symptoms occur.  Return your Holter monitor as directed by your health care provider.  Keep all follow-up visits as directed by your health care provider. This is important. Get help right away if:  You feel lightheaded or you faint.  You have trouble breathing.  You feel pain in your chest, upper arm, or jaw.  You feel sick to your stomach and your skin is pale, cool, or damp.  You heartbeat feels unusual or abnormal. This information is not intended to replace advice given to you by your health care provider. Make sure you discuss any questions you have with your health care provider. Document Released: 12/15/2003 Document Revised: 08/24/2015 Document Reviewed: 10/25/2013 Elsevier Interactive Patient Education  [employer].

## 2017-05-20 NOTE — Progress Notes (Signed)
Cardiology Office Note   Date:  05/20/2017   ID:  Toni Mccormick, DOB 1990/10/09, MRN [id number]  PCP:  Coral Spikes, DO  Cardiologist:   Kathlyn Sacramento, MD   Chief Complaint  Patient presents with  . New Patient (Initial Visit)    Referred by OBGYN for Irregular heart beat. Patient states she has had chest pain but not sure if it is from acid reflux. Meds reviewed verbally with patient.       History of Present Illness: Toni Mccormick is a 27 y.o. female who was referred by Toni Mccormick for evaluation of palpitations and shortness of breath.  The patient is [redacted] weeks pregnant.  She delivered a healthy boy 3 years ago but she did have preeclampsia and she had to take blood pressure medications for a few weeks.  She reports resolution of hypertension after that. Recently, she had few episodes of palpitations mostly at night when she is trying to sleep.  This was described as skipping and tachycardia.  Her mother is a Marine scientist at [employer] recovery and did a rhythm strip which showed PACs.  The patient also reports worsening shortness of breath without chest pain.  No orthopnea, PND or leg edema. She does not consume excessive amount of caffeine.  She is not a smoker and drinks alcohol only socially. There is no family history of arrhythmia or cardiomyopathy or coronary artery disease.   Past Medical History:  Diagnosis Date  . Chicken pox   . Gestational hypertension   . IBS (irritable bowel syndrome)   . Post partum depression     Past Surgical History:  Procedure Laterality Date  . TONSILLECTOMY AND ADENOIDECTOMY Bilateral 1992  . WISDOM TOOTH EXTRACTION       Current Outpatient Medications  Medication Sig Dispense Refill  . Prenatal Vit-Fe Fumarate-FA (PRENATAL MULTIVITAMIN) TABS tablet Take 1 tablet by mouth daily at 12 noon.     No current facility-administered medications for this visit.     Allergies:   Patient has no known allergies.    Social  History:  The patient  reports that  has never smoked. she has never used smokeless tobacco. She reports that she drinks alcohol. She reports that she does not use drugs.   Family History:  The patient's family history includes Arthritis in her paternal grandfather; Cancer in her maternal grandfather and paternal grandfather; Diabetes in her paternal grandmother; Hyperlipidemia in her father and maternal grandmother; Hypertension in her father.    ROS:  Please see the history of present illness.   Otherwise, review of systems are positive for none.   All other systems are reviewed and negative.    PHYSICAL EXAM: VS:  BP 110/68 (BP Location: Left Arm, Patient Position: Sitting, Cuff Size: Normal)   Pulse 79   Ht 5\' 5"  (1.651 m)   Wt 175 lb (79.4 kg)   LMP 03/11/2017   BMI 29.12 kg/m  , BMI Body mass index is 29.12 kg/m. GEN: Well nourished, well developed, in no acute distress  HEENT: normal  Neck: no JVD, carotid bruits, or masses Cardiac: RRR; no murmurs, rubs, or gallops,no edema  Respiratory:  clear to auscultation bilaterally, normal work of breathing GI: soft, nontender, nondistended, + BS MS: no deformity or atrophy  Skin: warm and dry, no rash Neuro:  Strength and sensation are intact Psych: euthymic mood, full affect   EKG:  EKG is ordered today. The ekg ordered today demonstrates normal sinus rhythm with  no significant ST or T wave changes.   Recent Labs: 09/24/2016: ALT 12; TSH 0.777 05/02/2017: BUN 12; Creatinine, Ser 0.68; Potassium 3.8; Sodium 137 05/09/2017: Hemoglobin 13.0; Platelets 318    Lipid Panel No results found for: CHOL, TRIG, HDL, CHOLHDL, VLDL, LDLCALC, LDLDIRECT    Wt Readings from Last 3 Encounters:  05/20/17 175 lb (79.4 kg)  05/09/17 172 lb 8 oz (78.2 kg)  05/02/17 170 lb (77.1 kg)     No flowsheet data found.    ASSESSMENT AND PLAN:  1.  Palpitations: Likely due to PACs.  I requested a 48-hour Holter monitor for evaluation.  She does  not consume excessive amount of caffeine.  2.  Dyspnea during pregnancy.  She had previous preeclampsia.  I requested an echocardiogram to evaluate LV systolic function.    Disposition:   FU with me as needed.  Signed,  Kathlyn Sacramento, MD  05/20/2017 5:07 PM    [place]

## 2017-06-06 ENCOUNTER — Encounter: Payer: Self-pay | Admitting: Certified Nurse Midwife

## 2017-06-06 ENCOUNTER — Ambulatory Visit (INDEPENDENT_AMBULATORY_CARE_PROVIDER_SITE_OTHER): Payer: BLUE CROSS/BLUE SHIELD | Admitting: Certified Nurse Midwife

## 2017-06-06 VITALS — BP 128/88 | HR 90 | Wt 177.6 lb

## 2017-06-06 DIAGNOSIS — N898 Other specified noninflammatory disorders of vagina: Secondary | ICD-10-CM

## 2017-06-06 DIAGNOSIS — Z8759 Personal history of other complications of pregnancy, childbirth and the puerperium: Secondary | ICD-10-CM

## 2017-06-06 DIAGNOSIS — Z3481 Encounter for supervision of other normal pregnancy, first trimester: Secondary | ICD-10-CM

## 2017-06-06 DIAGNOSIS — Z8751 Personal history of pre-term labor: Secondary | ICD-10-CM

## 2017-06-06 LAB — POCT URINALYSIS DIPSTICK
Bilirubin, UA: NEGATIVE
GLUCOSE: NEGATIVE
Ketones, UA: NEGATIVE
NITRITE UA: NEGATIVE
Odor: NEGATIVE
PH UA: 6.5 (ref 5.0–8.0)
Protein, UA: NEGATIVE
RBC UA: NEGATIVE
SPEC GRAV UA: 1.01 (ref 1.010–1.025)
UROBILINOGEN UA: 0.2 U/dL

## 2017-06-06 NOTE — Patient Instructions (Signed)
Preventing Preterm Birth Preterm birth is when your baby is delivered between 72 weeks and 37 weeks of pregnancy. A full-term pregnancy lasts for at least 37 weeks. Preterm birth can be dangerous for your baby because the last few weeks of pregnancy are an important time for your baby's brain and lungs to grow. Many things can cause a baby to be born early. Sometimes the cause is not known. There are certain factors that make you more likely to experience preterm birth, such as:  Having a previous baby born preterm.  Being pregnant with twins or other multiples.  Having had fertility treatment.  Being overweight or underweight at the start of your pregnancy.  Having any of the following during pregnancy: ? An infection, including a urinary tract infection (UTI) or an STI (sexually transmitted infection). ? High blood pressure. ? Diabetes. ? Vaginal bleeding.  Being age 29 or older.  Being age 29 or younger.  Getting pregnant within 6 months of a previous pregnancy.  Suffering extreme stress or physical or emotional abuse during pregnancy.  Standing for long periods of time during pregnancy, such as working at a job that requires standing.  What are the risks? The most serious risk of preterm birth is that the baby may not survive. This is more likely to happen if a baby is born before 13 weeks. Other risks and complications of preterm birth may include your baby having:  Breathing problems.  Brain damage that affects movement and coordination (cerebral palsy).  Feeding difficulties.  Vision or hearing problems.  Infections or inflammation of the digestive tract (colitis).  Developmental delays.  Learning disabilities.  Higher risk for diabetes, heart disease, and high blood pressure later in life.  What can I do to lower my risk? Medical care  The most important thing you can do to lower your risk for preterm birth is to get routine medical care during pregnancy  (prenatal care). If you have a high risk of preterm birth, you may be referred to a health care provider who specializes in managing high-risk pregnancies (perinatologist). You may be given medicine to help prevent preterm birth. Lifestyle changes Certain lifestyle changes can also lower your risk of preterm birth:  Wait at least 6 months after a pregnancy to become pregnant again.  Try to plan pregnancy for when you are between 29 and 77 years old.  Get to a healthy weight before getting pregnant. If you are overweight, work with your health care provider to safely lose weight.  Do not use any products that contain nicotine or tobacco, such as cigarettes and e-cigarettes. If you need help quitting, ask your health care provider.  Do not drink alcohol.  Do not use drugs.  Where to find support: For more support, consider:  Talking with your health care provider.  Talking with a therapist or substance abuse counselor, if you need help quitting.  Working with a diet and nutrition specialist (dietitian) or a Physiological scientist to maintain a healthy weight.  Joining a support group.  Where to find more information: Learn more about preventing preterm birth from:  Centers for Disease Control and Prevention: VoipObserver.com.br  March of Dimes: marchofdimes.org/complications/premature-babies.aspx  American Pregnancy Association: americanpregnancy.org/labor-and-birth/premature-labor  Contact a health care provider if:  You have any of the following signs of preterm labor before 37 weeks: ? A change or increase in vaginal discharge. ? Fluid leaking from your vagina. ? Pressure or cramps in your lower abdomen. ? A backache that does not  go away or gets worse. ? Regular tightening (contractions) in your lower abdomen. Summary  Preterm birth means having your baby during weeks 31-37 of pregnancy.  Preterm birth may put your baby at risk  for physical and mental problems.  Getting good prenatal care can help prevent preterm birth.  You can lower your risk of preterm birth by making certain lifestyle changes, such as not smoking and not using alcohol. This information is not intended to replace advice given to you by your health care provider. Make sure you discuss any questions you have with your health care provider. Document Released: 05/02/2015 Document Revised: 11/25/2015 Document Reviewed: 11/25/2015 Elsevier Interactive Patient Education  2018 [employer]. Hydroxyprogesterone solution for injection What is this medicine? HYDROXYPROGESTERONE (hye drox ee proe JES ter one) is a female hormone. This medicine is used in women who are pregnant and who have delivered a baby too early (preterm) in the past. It helps lower the risk of having a preterm baby again. This medicine may be used for other purposes; ask your health care provider or pharmacist if you have questions. COMMON BRAND NAME(S): Makena What should I tell my health care provider before I take this medicine? They need to know if you have any of these conditions: -blood clotting disorders -breast, cervical, uterine, or vaginal cancer -depression -diabetes or prediabetes -heart disease -high blood pressure -kidney disease -liver disease -lung or breathing disease, like asthma -migraine headaches -seizures -vaginal bleeding -an unusual or allergic reaction to hydroxyprogesterone, other hormones, medicines, foods, dyes, castor oil, benzyl alcohol, or other preservatives -breast-feeding How should I use this medicine? This medicine is for injection into a muscle. It is given by a health care professional in a hospital or clinic setting. You are likely to get an injection once a week to prevent preterm delivery. Talk to your pediatrician regarding the use of this medicine in children. Special care may be needed. Overdosage: If you think you have taken too much  of this medicine contact a poison control center or emergency room at once. NOTE: This medicine is only for you. Do not share this medicine with others. What if I miss a dose? It is important not to miss your dose. Call your doctor or health care professional if you are unable to keep an appointment. What may interact with this medicine? -acetaminophen -bupropion -clozapine -efavirenz -halothane -methadone -nicotine -theophylline, aminophylline -tizanidine This list may not describe all possible interactions. Give your health care provider a list of all the medicines, herbs, non-prescription drugs, or dietary supplements you use. Also tell them if you smoke, drink alcohol, or use illegal drugs. Some items may interact with your medicine. What should I watch for while using this medicine? Your condition will be monitored carefully while you are receiving this medicine. What side effects may I notice from receiving this medicine? Side effects that you should report to your doctor or health care professional as soon as possible: -allergic reactions like skin rash, itching or hives, swelling of the face, lips, or tongue -breathing problems -breast tissue changes or discharge -changes in vision -confusion, trouble speaking or understanding -depressed mood -increased hunger or thirst -increased urination -pain, redness, or irritation at site where injected -pain, swelling, warmth in the leg -shortness of breath, chest pain, swelling in a leg -sudden numbness or weakness of the face, arm or leg -sudden severe headaches -trouble walking, dizziness, loss of balance or coordination -unusually weak or tired -vaginal bleeding -yellowing of the eyes or skin  Side effects that usually do not require medical attention (report to your doctor or health care professional if they continue or are bothersome): -changes in emotions or moods -diarrhea -fluid retention and swelling -nausea This list  may not describe all possible side effects. Call your doctor for medical advice about side effects. You may report side effects to FDA at 1-800-FDA-1088. Where should I keep my medicine? This drug is given in a hospital or clinic and will not be stored at home. NOTE: This sheet is a summary. It may not cover all possible information. If you have questions about this medicine, talk to your doctor, pharmacist, or health care provider.  2018 Elsevier/Gold Standard (2009-05-09 11:17:12) WHAT OB PATIENTS CAN EXPECT   Confirmation of pregnancy and ultrasound ordered if medically indicated-[redacted] weeks gestation  New OB (NOB) intake with nurse and New OB (NOB) labs- [redacted] weeks gestation  New OB (NOB) physical examination with provider- 11/[redacted] weeks gestation  Flu vaccine-[redacted] weeks gestation  Anatomy scan-[redacted] weeks gestation  Glucose tolerance test, blood work to test for anemia, T-dap vaccine-[redacted] weeks gestation  Vaginal swabs/cultures-STD/Group B strep-[redacted] weeks gestation  Appointments every 4 weeks until 28 weeks  Every 2 weeks from 28 weeks until 36 weeks  Weekly visits from 36 weeks until delivery  Eating Plan for Pregnant Women While you are pregnant, your body will require additional nutrition to help support your growing baby. It is recommended that you consume:  150 additional calories each day during your first trimester.  300 additional calories each day during your second trimester.  300 additional calories each day during your third trimester.  Eating a healthy, well-balanced diet is very important for your health and for your baby's health. You also have a higher need for some vitamins and minerals, such as folic acid, calcium, iron, and vitamin D. What do I need to know about eating during pregnancy?  Do not try to lose weight or go on a diet during pregnancy.  Choose healthy, nutritious foods. Choose  of a sandwich with a glass of milk instead of a candy bar or a high-calorie  sugar-sweetened beverage.  Limit your overall intake of foods that have "empty calories." These are foods that have little nutritional value, such as sweets, desserts, candies, sugar-sweetened beverages, and fried foods.  Eat a variety of foods, especially fruits and vegetables.  Take a prenatal vitamin to help meet the additional needs during pregnancy, specifically for folic acid, iron, calcium, and vitamin D.  Remember to stay active. Ask your health care provider for exercise recommendations that are specific to you.  Practice good food safety and cleanliness, such as washing your hands before you eat and after you prepare raw meat. This helps to prevent foodborne illnesses, such as listeriosis, that can be very dangerous for your baby. Ask your health care provider for more information about listeriosis. What does 150 extra calories look like? Healthy options for an additional 150 calories each day could be any of the following:  Plain low-fat yogurt (6-8 oz) with  cup of berries.  1 apple with 2 teaspoons of peanut butter.  Cut-up vegetables with  cup of hummus.  Low-fat chocolate milk (8 oz or 1 cup).  1 string cheese with 1 medium orange.   of a peanut butter and jelly sandwich on whole-wheat bread (1 tsp of peanut butter).  For 300 calories, you could eat two of those healthy options each day. What is a healthy amount of weight to gain? The  recommended amount of weight for you to gain is based on your pre-pregnancy BMI. If your pre-pregnancy BMI was:  Less than 18 (underweight), you should gain 28-40 lb.  18-24.9 (normal), you should gain 25-35 lb.  25-29.9 (overweight), you should gain 15-25 lb.  Greater than 30 (obese), you should gain 11-20 lb.  What if I am having twins or multiples? Generally, pregnant women who will be having twins or multiples may need to increase their daily calories by 300-600 calories each day. The recommended range for total weight gain  is 25-54 lb, depending on your pre-pregnancy BMI. Talk with your health care provider for specific guidance about additional nutritional needs, weight gain, and exercise during your pregnancy. What foods can I eat? Grains Any grains. Try to choose whole grains, such as whole-wheat bread, oatmeal, or brown rice. Vegetables Any vegetables. Try to eat a variety of colors and types of vegetables to get a full range of vitamins and minerals. Remember to wash your vegetables well before eating. Fruits Any fruits. Try to eat a variety of colors and types of fruit to get a full range of vitamins and minerals. Remember to wash your fruits well before eating. Meats and Other Protein Sources Lean meats, including chicken, [place], fish, and lean cuts of beef, veal, or pork. Make sure that all meats are cooked to "well done." Tofu. Tempeh. Beans. Eggs. Peanut butter and other nut butters. Seafood, such as shrimp, crab, and lobster. If you choose fish, select types that are higher in omega-3 fatty acids, including salmon, herring, mussels, trout, sardines, and pollock. Make sure that all meats are cooked to food-safe temperatures. Dairy Pasteurized milk and milk alternatives. Pasteurized yogurt and pasteurized cheese. Cottage cheese. Sour cream. Beverages Water. Juices that contain 100% fruit juice or vegetable juice. Caffeine-free teas and decaffeinated coffee. Drinks that contain caffeine are okay to drink, but it is better to avoid caffeine. Keep your total caffeine intake to less than 200 mg each day (12 oz of coffee, tea, or soda) or as directed by your health care provider. Condiments Any pasteurized condiments. Sweets and Desserts Any sweets and desserts. Fats and Oils Any fats and oils. The items listed above may not be a complete list of recommended foods or beverages. Contact your dietitian for more options. What foods are not recommended? Vegetables Unpasteurized (raw) vegetable  juices. Fruits Unpasteurized (raw) fruit juices. Meats and Other Protein Sources Cured meats that have nitrates, such as bacon, salami, and hotdogs. Luncheon meats, bologna, or other deli meats (unless they are reheated until they are steaming hot). Refrigerated pate, meat spreads from a meat counter, smoked seafood that is found in the refrigerated section of a store. Raw fish, such as sushi or sashimi. High mercury content fish, such as tilefish, shark, swordfish, and king mackerel. Raw meats, such as tuna or beef tartare. Undercooked meats and poultry. Make sure that all meats are cooked to food-safe temperatures. Dairy Unpasteurized (raw) milk and any foods that have raw milk in them. Soft cheeses, such as feta, queso blanco, queso fresco, Brie, Camembert cheeses, blue-veined cheeses, and Panela cheese (unless it is made with pasteurized milk, which must be stated on the label). Beverages Alcohol. Sugar-sweetened beverages, such as sodas, teas, or energy drinks. Condiments Homemade fermented foods and drinks, such as pickles, sauerkraut, or kombucha drinks. (Store-bought pasteurized versions of these are okay.) Other Salads that are made in the store, such as ham salad, chicken salad, egg salad, tuna salad, and seafood salad. The  items listed above may not be a complete list of foods and beverages to avoid. Contact your dietitian for more information. This information is not intended to replace advice given to you by your health care provider. Make sure you discuss any questions you have with your health care provider. Document Released: 12/31/2013 Document Revised: 08/24/2015 Document Reviewed: 08/31/2013 Elsevier Interactive Patient Education  2018 [employer]. Morning Sickness Morning sickness is when you feel sick to your stomach (nauseous) during pregnancy. You may feel sick to your stomach and throw up (vomit). You may feel sick in the morning, but you can feel this way any time of day.  Some women feel very sick to their stomach and cannot stop throwing up (hyperemesis gravidarum). Follow these instructions at home:  Only take medicines as told by your doctor.  Take multivitamins as told by your doctor. Taking multivitamins before getting pregnant can stop or lessen the harshness of morning sickness.  Eat dry toast or unsalted crackers before getting out of bed.  Eat 5 to 6 small meals a day.  Eat dry and bland foods like rice and baked potatoes.  Do not drink liquids with meals. Drink between meals.  Do not eat greasy, fatty, or spicy foods.  Have someone cook for you if the smell of food causes you to feel sick or throw up.  If you feel sick to your stomach after taking prenatal vitamins, take them at night or with a snack.  Eat protein when you need a snack (nuts, yogurt, cheese).  Eat unsweetened gelatins for dessert.  Wear a bracelet used for sea sickness (acupressure wristband).  Go to a doctor that puts thin needles into certain body points (acupuncture) to improve how you feel.  Do not smoke.  Use a humidifier to keep the air in your house free of odors.  Get lots of fresh air. Contact a doctor if:  You need medicine to feel better.  You feel dizzy or lightheaded.  You are losing weight. Get help right away if:  You feel very sick to your stomach and cannot stop throwing up.  You pass out (faint). This information is not intended to replace advice given to you by your health care provider. Make sure you discuss any questions you have with your health care provider. Document Released: 04/25/2004 Document Revised: 08/24/2015 Document Reviewed: 09/02/2012 Elsevier Interactive Patient Education  2017 [place]. Common Medications Safe in Pregnancy  Acne:      Constipation:  Benzoyl Peroxide     Colace  Clindamycin      Dulcolax Suppository  Topica Erythromycin     Fibercon  Salicylic Acid      Metamucil         Miralax AVOID:         Senakot   Accutane    Cough:  Retin-A       Cough Drops  Tetracycline      Phenergan w/ Codeine if Rx  Minocycline      Robitussin (Plain & DM)  Antibiotics:     Crabs/Lice:  Ceclor       RID  Cephalosporins    AVOID:  E-Mycins      Kwell  Keflex  Macrobid/Macrodantin   Diarrhea:  Penicillin      Kao-Pectate  Zithromax      Imodium AD         PUSH FLUIDS AVOID:       Cipro     Fever:  Tetracycline  Tylenol (Regular or Extra  Minocycline       Strength)  Levaquin      Extra Strength-Do not          Exceed 8 tabs/24 hrs Caffeine:        <281m/day (equiv. To 1 cup of coffee or  approx. 3 12 oz sodas)         Gas: Cold/Hayfever:       Gas-X  Benadryl      Mylicon  Claritin       Phazyme  **Claritin-D        Chlor-Trimeton    Headaches:  Dimetapp      ASA-Free Excedrin  Drixoral-Non-Drowsy     Cold Compress  Mucinex (Guaifenasin)     Tylenol (Regular or Extra  Sudafed/Sudafed-12 Hour     Strength)  **Sudafed PE Pseudoephedrine   Tylenol Cold & Sinus     Vicks Vapor Rub  Zyrtec  **AVOID if Problems With Blood Pressure         Heartburn: Avoid lying down for at least 1 hour after meals  Aciphex      Maalox     Rash:  Milk of Magnesia     Benadryl    Mylanta       1% Hydrocortisone Cream  Pepcid  Pepcid Complete   Sleep Aids:  Prevacid      Ambien   Prilosec       Benadryl  Rolaids       Chamomile Tea  Tums (Limit 4/day)     Unisom  Zantac       Tylenol PM         Warm milk-add vanilla or  Hemorrhoids:       Sugar for taste  Anusol/Anusol H.C.  (RX: Analapram 2.5%)  Sugar Substitutes:  Hydrocortisone OTC     Ok in moderation  Preparation H      Tucks        Vaseline lotion applied to tissue with wiping    Herpes:     Throat:  Acyclovir      Oragel  Famvir  Valtrex     Vaccines:         Flu Shot Leg Cramps:       *Gardasil  Benadryl      Hepatitis A         Hepatitis B Nasal Spray:       Pneumovax  Saline Nasal Spray     Polio  Booster         Tetanus Nausea:       Tuberculosis test or PPD  Vitamin B6 25 mg TID   AVOID:    Dramamine      *Gardasil  Emetrol       Live Poliovirus  Ginger Root 250 mg QID    MMR (measles, mumps &  High Complex Carbs @ Bedtime    rebella)  Sea Bands-Accupressure    Varicella (Chickenpox)  Unisom 1/2 tab TID     *No known complications           If received before Pain:         Known pregnancy;   Darvocet       Resume series after  Lortab        Delivery  Percocet    Yeast:   Tramadol      Femstat  Tylenol 3      Gyne-lotrimin  Ultram       Monistat  Vicodin           MISC:         All Sunscreens           Hair Coloring/highlights          Insect Repellant's          (Including DEET)         Mystic Tans Second Trimester of Pregnancy The second trimester is from week 13 through week 28, month 4 through 6. This is often the time in pregnancy that you feel your best. Often times, morning sickness has lessened or quit. You may have more energy, and you may get hungry more often. Your unborn baby (fetus) is growing rapidly. At the end of the sixth month, he or she is about 9 inches long and weighs about 1 pounds. You will likely feel the baby move (quickening) between 18 and 20 weeks of pregnancy. Follow these instructions at home:  Avoid all smoking, herbs, and alcohol. Avoid drugs not approved by your doctor.  Do not use any tobacco products, including cigarettes, chewing tobacco, and electronic cigarettes. If you need help quitting, ask your doctor. You may get counseling or other support to help you quit.  Only take medicine as told by your doctor. Some medicines are safe and some are not during pregnancy.  Exercise only as told by your doctor. Stop exercising if you start having cramps.  Eat regular, healthy meals.  Wear a good support bra if your breasts are tender.  Do not use hot tubs, steam rooms, or saunas.  Wear your seat belt when driving.  Avoid raw meat,  uncooked cheese, and liter boxes and soil used by cats.  Take your prenatal vitamins.  Take 1500-2000 milligrams of calcium daily starting at the 20th week of pregnancy until you deliver your baby.  Try taking medicine that helps you poop (stool softener) as needed, and if your doctor approves. Eat more fiber by eating fresh fruit, vegetables, and whole grains. Drink enough fluids to keep your pee (urine) clear or pale yellow.  Take warm water baths (sitz baths) to soothe pain or discomfort caused by hemorrhoids. Use hemorrhoid cream if your doctor approves.  If you have puffy, bulging veins (varicose veins), wear support hose. Raise (elevate) your feet for 15 minutes, 3-4 times a day. Limit salt in your diet.  Avoid heavy lifting, wear low heals, and sit up straight.  Rest with your legs raised if you have leg cramps or low back pain.  Visit your dentist if you have not gone during your pregnancy. Use a soft toothbrush to brush your teeth. Be gentle when you floss.  You can have sex (intercourse) unless your doctor tells you not to.  Go to your doctor visits. Get help if:  You feel dizzy.  You have mild cramps or pressure in your lower belly (abdomen).  You have a nagging pain in your belly area.  You continue to feel sick to your stomach (nauseous), throw up (vomit), or have watery poop (diarrhea).  You have bad smelling fluid coming from your vagina.  You have pain with peeing (urination). Get help right away if:  You have a fever.  You are leaking fluid from your vagina.  You have spotting or bleeding from your vagina.  You have severe belly cramping or pain.  You lose or gain weight rapidly.  You have trouble catching your breath and have chest pain.  You notice sudden or extreme puffiness (  swelling) of your face, hands, ankles, feet, or legs.  You have not felt the baby move in over an hour.  You have severe headaches that do not go away with medicine.  You  have vision changes. This information is not intended to replace advice given to you by your health care provider. Make sure you discuss any questions you have with your health care provider. Document Released: 06/12/2009 Document Revised: 08/24/2015 Document Reviewed: 05/19/2012 Elsevier Interactive Patient Education  2017 [employer].

## 2017-06-06 NOTE — Progress Notes (Signed)
NOB PE- Pos for green/yellow discharge- Pos for odor. Last pap 05/2015 neg.

## 2017-06-06 NOTE — Progress Notes (Signed)
NEW OB HISTORY AND PHYSICAL  SUBJECTIVE:       Toni Mccormick is a 27 y.o. G25P0101 female, Patient's last menstrual period was 03/11/2017., Estimated Date of Delivery: 12/16/17, [redacted]w[redacted]d, presents today for establishment of Prenatal Care.  She has no unusual complaints. Endorses nausea without vomiting, gassy achy feeling and increased vaginal dischagre.   Denies difficulty breathing or respiratory distress, chest pain, abdominal pain, vaginal bleeding, dysuria, and leg pain or swelling.    Requests postpartum Pap. Declines genetic screening.   Gynecologic History  Patient's last menstrual period was 03/11/2017.   Contraception: abstinence  Last Pap: 05/2015. Results were: normal  Obstetric History  OB History  Gravida Para Term Preterm AB Living  2 1 0 1   1  SAB TAB Ectopic Multiple Live Births          1    # Outcome Date GA Lbr Len/2nd Weight Sex Delivery Anes PTL Lv  2 Current           1 Preterm 04/29/14    M Vag-Spont   LIV      Past Medical History:  Diagnosis Date  . Chicken pox   . Gestational hypertension   . IBS (irritable bowel syndrome)   . Post partum depression     Past Surgical History:  Procedure Laterality Date  . TONSILLECTOMY AND ADENOIDECTOMY Bilateral 1992  . WISDOM TOOTH EXTRACTION      Current Outpatient Medications on File Prior to Visit  Medication Sig Dispense Refill  . Prenatal Vit-Fe Fumarate-FA (PRENATAL MULTIVITAMIN) TABS tablet Take 1 tablet by mouth daily at 12 noon.     No current facility-administered medications on file prior to visit.     No Known Allergies  Social History   Socioeconomic History  . Marital status: Married    Spouse name: Not on file  . Number of children: Not on file  . Years of education: Not on file  . Highest education level: Not on file  Social Needs  . Financial resource strain: Not on file  . Food insecurity - worry: Not on file  . Food insecurity - inability: Not on file  .  Transportation needs - medical: Not on file  . Transportation needs - non-medical: Not on file  Occupational History  . Not on file  Tobacco Use  . Smoking status: Never Smoker  . Smokeless tobacco: Never Used  Substance and Sexual Activity  . Alcohol use: Yes    Comment: RARE  . Drug use: No  . Sexual activity: Yes  Other Topics Concern  . Not on file  Social History Narrative  . Not on file    Family History  Problem Relation Age of Onset  . Hyperlipidemia Father   . Hypertension Father   . Hyperlipidemia Maternal Grandmother   . Cancer Maternal Grandfather        lung  . Diabetes Paternal Grandmother   . Arthritis Paternal Grandfather   . Cancer Paternal Grandfather        lung    The following portions of the patient's history were reviewed and updated as appropriate: allergies, current medications, past OB history, past medical history, past surgical history, past family history, past social history, and problem list.   OBJECTIVE:  BP 128/88   Pulse 90   Wt 177 lb 9.6 oz (80.6 kg)   LMP 03/11/2017   BMI 29.55 kg/m   Initial Physical Exam (New OB)  GENERAL APPEARANCE: alert, well appearing, in  no apparent distress  HEAD: normocephalic, atraumatic  MOUTH: mucous membranes moist, pharynx normal without lesions  THYROID: no thyromegaly or masses present  BREASTS: no masses noted, no significant tenderness, no palpable axillary nodes, no skin changes  LUNGS: clear to auscultation, no wheezes, rales or rhonchi, symmetric air entry  HEART: regular rate and rhythm, no murmurs  ABDOMEN: soft, nontender, nondistended, no abnormal masses, no epigastric pain and FHT present  EXTREMITIES: no redness or tenderness in the calves or thighs, no edema  SKIN: normal coloration and turgor, no rashes  LYMPH NODES: no adenopathy palpable  NEUROLOGIC: alert, oriented, normal speech, no focal findings or movement disorder noted  PELVIC EXAM: not  indicated  ASSESSMENT: Normal pregnancy Declines genetic screening History pre-eclampsia History of preterm birth  PLAN: Prenatal care NuSwab collected, see orders Discussed use of baby aspirin and 17P in pregnancy, patient will review literature and contact office with decision regarding both medications. New OB counseling: The patient has been given an overview regarding routine prenatal care. Recommendations regarding diet, weight gain, and exercise in pregnancy were given. Prenatal testing, optional genetic testing, and ultrasound use in pregnancy were reviewed.  Benefits of Breast Feeding were discussed. The patient is encouraged to consider nursing her baby post partum. See orders   Lara Mulch Tova Vater,CNM [facility], [facility]

## 2017-06-09 DIAGNOSIS — Z8759 Personal history of other complications of pregnancy, childbirth and the puerperium: Secondary | ICD-10-CM | POA: Insufficient documentation

## 2017-06-09 DIAGNOSIS — Z8751 Personal history of pre-term labor: Secondary | ICD-10-CM | POA: Insufficient documentation

## 2017-06-10 ENCOUNTER — Ambulatory Visit (INDEPENDENT_AMBULATORY_CARE_PROVIDER_SITE_OTHER): Payer: BLUE CROSS/BLUE SHIELD

## 2017-06-10 DIAGNOSIS — R002 Palpitations: Secondary | ICD-10-CM

## 2017-06-10 LAB — NUSWAB VAGINITIS PLUS (VG+)
CANDIDA GLABRATA, NAA: NEGATIVE
Candida albicans, NAA: NEGATIVE
Chlamydia trachomatis, NAA: NEGATIVE
Neisseria gonorrhoeae, NAA: NEGATIVE
Trich vag by NAA: NEGATIVE

## 2017-06-12 ENCOUNTER — Other Ambulatory Visit: Payer: Self-pay

## 2017-06-12 ENCOUNTER — Ambulatory Visit (INDEPENDENT_AMBULATORY_CARE_PROVIDER_SITE_OTHER): Payer: BLUE CROSS/BLUE SHIELD

## 2017-06-12 DIAGNOSIS — R0602 Shortness of breath: Secondary | ICD-10-CM

## 2017-06-13 ENCOUNTER — Other Ambulatory Visit: Payer: BLUE CROSS/BLUE SHIELD

## 2017-06-17 ENCOUNTER — Ambulatory Visit
Admission: RE | Admit: 2017-06-17 | Discharge: 2017-06-17 | Disposition: A | Discharge status: Home / Self Care | Payer: BLUE CROSS/BLUE SHIELD | Source: Ambulatory Visit | Attending: Cardiovascular Disease | Admitting: Cardiovascular Disease

## 2017-06-17 DIAGNOSIS — R002 Palpitations: Secondary | ICD-10-CM | POA: Diagnosis present

## 2017-06-17 DIAGNOSIS — I493 Ventricular premature depolarization: Secondary | ICD-10-CM | POA: Diagnosis not present

## 2017-06-17 DIAGNOSIS — I491 Atrial premature depolarization: Secondary | ICD-10-CM | POA: Insufficient documentation

## 2017-06-17 DIAGNOSIS — R000 Tachycardia, unspecified: Secondary | ICD-10-CM | POA: Diagnosis not present

## 2017-07-07 ENCOUNTER — Ambulatory Visit (INDEPENDENT_AMBULATORY_CARE_PROVIDER_SITE_OTHER): Payer: BLUE CROSS/BLUE SHIELD | Admitting: Certified Nurse Midwife

## 2017-07-07 ENCOUNTER — Encounter: Payer: Self-pay | Admitting: Certified Nurse Midwife

## 2017-07-07 VITALS — BP 108/71 | HR 84 | Wt 185.5 lb

## 2017-07-07 DIAGNOSIS — F419 Anxiety disorder, unspecified: Secondary | ICD-10-CM

## 2017-07-07 DIAGNOSIS — Z3481 Encounter for supervision of other normal pregnancy, first trimester: Secondary | ICD-10-CM

## 2017-07-07 DIAGNOSIS — Z3A20 20 weeks gestation of pregnancy: Secondary | ICD-10-CM

## 2017-07-07 LAB — POCT URINALYSIS DIPSTICK
Bilirubin, UA: NEGATIVE
GLUCOSE UA: NEGATIVE
Ketones, UA: NEGATIVE
LEUKOCYTES UA: NEGATIVE
Nitrite, UA: NEGATIVE
Protein, UA: NEGATIVE
RBC UA: NEGATIVE
Spec Grav, UA: 1.02 (ref 1.010–1.025)
Urobilinogen, UA: 0.2 E.U./dL
pH, UA: 5 (ref 5.0–8.0)

## 2017-07-07 MED ORDER — ASPIRIN 81 MG PO TBEC: 81.0000 mg | DELAYED_RELEASE_TABLET | Freq: Every day | ORAL | 12 refills | Status: DC

## 2017-07-07 NOTE — Progress Notes (Signed)
Pt is here for an [name] visit. 

## 2017-07-07 NOTE — Progress Notes (Signed)
[name], she states she has history of having fear of vomit. She was mediated in the past with Lexapro that worked but she starting having brain zap so she stopped. PT is visibly upset when talking about it. She is fidgeting her hands. She states that she has seen a counselor in the past but she could not afford it. Well place order for referral to mental health. Message to Pricilla Loveless for further assistance. Encouraged the patient to call her insurance to confirm her benefits for mental health. Discussed use of medications I.e. Zoloft/lexapro. She declines at this time. Reviewed hx. PTB @ 34 wks and hx pre E. She declines the 17 P . She will try the Asprin with possibility of stopping if her IBS becomes inflamed.

## 2017-07-07 NOTE — Patient Instructions (Signed)

## 2017-07-14 ENCOUNTER — Telehealth: Payer: Self-pay

## 2017-07-14 ENCOUNTER — Telehealth: Payer: Self-pay | Admitting: Certified Nurse Midwife

## 2017-07-14 ENCOUNTER — Ambulatory Visit (INDEPENDENT_AMBULATORY_CARE_PROVIDER_SITE_OTHER): Payer: BLUE CROSS/BLUE SHIELD | Admitting: Certified Nurse Midwife

## 2017-07-14 ENCOUNTER — Encounter: Payer: Self-pay | Admitting: Certified Nurse Midwife

## 2017-07-14 VITALS — BP 118/68 | HR 68 | Wt 183.4 lb

## 2017-07-14 DIAGNOSIS — N949 Unspecified condition associated with female genital organs and menstrual cycle: Secondary | ICD-10-CM

## 2017-07-14 DIAGNOSIS — Z3482 Encounter for supervision of other normal pregnancy, second trimester: Secondary | ICD-10-CM

## 2017-07-14 LAB — POCT URINALYSIS DIPSTICK
Bilirubin, UA: NEGATIVE
Blood, UA: NEGATIVE
Glucose, UA: NEGATIVE
Ketones, UA: NEGATIVE
LEUKOCYTES UA: NEGATIVE
NITRITE UA: NEGATIVE
PROTEIN UA: NEGATIVE
SPEC GRAV UA: 1.01 (ref 1.010–1.025)
Urobilinogen, UA: 0.2 E.U./dL
pH, UA: 5 (ref 5.0–8.0)

## 2017-07-14 NOTE — Telephone Encounter (Signed)
Patient called with questions regarding a mychart conversation with you. Thanks

## 2017-07-14 NOTE — Telephone Encounter (Signed)
Spoke with pt- reassured her after speaking with provider- its very possible she has an infection causing discomfort. Will await swab results. Encouraged to let us know if bleeding becomes heavier or is accompanied by cramping. Also to use more lubrication for intercourse. Pt expressed understanding.

## 2017-07-14 NOTE — Patient Instructions (Signed)

## 2017-07-14 NOTE — Progress Notes (Signed)
Patient is c/o vaginal burning, irritation, and pulling in the vagina. Patient reports intercourse this past Saturday with spotting. Reassurance given. Reviewed round ligament. Patient is unsure of increased vaginal discharge. PTL precautions reviewed. Nuswab today. Will follow up with results. Return as scheduled.   Shanika Creacy,SNM/ Wyvonnia Dusky, CNM

## 2017-07-14 NOTE — Progress Notes (Signed)
Pt is here with c/o spotting on Sat after intercourse on Thursday. Mild cramping but is not sure if its her abdomen. Now feels pulling and burning in the vaginal area.

## 2017-07-17 ENCOUNTER — Encounter (INDEPENDENT_AMBULATORY_CARE_PROVIDER_SITE_OTHER): Payer: Self-pay

## 2017-07-17 LAB — NUSWAB BV AND CANDIDA, NAA
Candida albicans, NAA: NEGATIVE
Candida glabrata, NAA: NEGATIVE

## 2017-08-08 ENCOUNTER — Ambulatory Visit (INDEPENDENT_AMBULATORY_CARE_PROVIDER_SITE_OTHER): Payer: BLUE CROSS/BLUE SHIELD

## 2017-08-08 ENCOUNTER — Ambulatory Visit (INDEPENDENT_AMBULATORY_CARE_PROVIDER_SITE_OTHER): Payer: BLUE CROSS/BLUE SHIELD | Admitting: Obstetrics and Gynecology

## 2017-08-08 VITALS — BP 118/86 | HR 94 | Wt 190.5 lb

## 2017-08-08 DIAGNOSIS — Z3A20 20 weeks gestation of pregnancy: Secondary | ICD-10-CM

## 2017-08-08 DIAGNOSIS — Z3492 Encounter for supervision of normal pregnancy, unspecified, second trimester: Secondary | ICD-10-CM

## 2017-08-08 LAB — POCT URINALYSIS DIPSTICK
BILIRUBIN UA: NEGATIVE
Blood, UA: NEGATIVE
GLUCOSE UA: NEGATIVE
Ketones, UA: NEGATIVE
Leukocytes, UA: NEGATIVE
Nitrite, UA: NEGATIVE
PH UA: 6 (ref 5.0–8.0)
Protein, UA: NEGATIVE
Spec Grav, UA: 1.01 (ref 1.010–1.025)
UROBILINOGEN UA: 0.2 U/dL

## 2017-08-08 NOTE — Progress Notes (Signed)
[name]- anatomy scan done today, pt doesn't want husband to know!!!!, pt is c/o fatigue

## 2017-08-08 NOTE — Progress Notes (Signed)
Anatomy scan and [name] Reviewed scan below: Indications: Anatomy Findings:  Singleton intrauterine pregnancy is visualized with FHR at 152 BPM. Biometrics give an (U/S) Gestational age of 22 2/7 weeks and an (U/S) EDD of 12/10/17; this correlates with the clinically established EDD of 12/16/17.  Fetal presentation is breech.  EFW: 483 grams (1lb 1oz). Placenta: Anterior and grade 1. AFI: WNL subjectively.  Anatomic survey is complete and appears WNL; Gender - Surprise.   Right Ovary measures 3.5 x 3.4 x 2.7 cm and appears echogenic consistent with dermoid appearance.  Left Ovary measures 3.4 x 3.8 x 2.7 cm and appears echogenic consistent with dermoid appearance.  There is no obvious evidence of a corpus luteal cyst. Survey of the adnexa demonstrates no adnexal masses. There is no free peritoneal fluid in the cul de sac.  Impression: 1. 22 2/7 week Viable Singleton Intrauterine pregnancy by U/S. 2. (U/S) EDD is consistent with Clinically established (LMP) EDD of 12/16/17. 3. Normal Anatomy Scan 4. Bilateral ovarian dermoids.   Trying to Get in with counselor. Hasn't started baby aspirin yet.

## 2017-09-04 ENCOUNTER — Ambulatory Visit (INDEPENDENT_AMBULATORY_CARE_PROVIDER_SITE_OTHER): Payer: BLUE CROSS/BLUE SHIELD | Admitting: Certified Nurse Midwife

## 2017-09-04 VITALS — BP 111/74 | HR 91 | Wt 195.0 lb

## 2017-09-04 DIAGNOSIS — Z113 Encounter for screening for infections with a predominantly sexual mode of transmission: Secondary | ICD-10-CM

## 2017-09-04 DIAGNOSIS — Z131 Encounter for screening for diabetes mellitus: Secondary | ICD-10-CM

## 2017-09-04 DIAGNOSIS — Z130 Encounter for screening for diseases of the blood and blood-forming organs and certain disorders involving the immune mechanism: Secondary | ICD-10-CM

## 2017-09-04 DIAGNOSIS — Z3482 Encounter for supervision of other normal pregnancy, second trimester: Secondary | ICD-10-CM

## 2017-09-04 DIAGNOSIS — Z8759 Personal history of other complications of pregnancy, childbirth and the puerperium: Secondary | ICD-10-CM

## 2017-09-04 LAB — POCT URINALYSIS DIPSTICK
BILIRUBIN UA: NEGATIVE
Glucose, UA: NEGATIVE
KETONES UA: NEGATIVE
Leukocytes, UA: NEGATIVE
Nitrite, UA: NEGATIVE
PH UA: 6.5 (ref 5.0–8.0)
Protein, UA: NEGATIVE
RBC UA: NEGATIVE
UROBILINOGEN UA: 0.2 U/dL

## 2017-09-04 NOTE — Progress Notes (Signed)
Pt is here for an [name] visit. 

## 2017-09-04 NOTE — Patient Instructions (Signed)
Common Medications Safe in Pregnancy  Acne:      Constipation:  Benzoyl Peroxide     Colace  Clindamycin      Dulcolax Suppository  Topica Erythromycin     Fibercon  Salicylic Acid      Metamucil         Miralax AVOID:        Senakot   Accutane    Cough:  Retin-A       Cough Drops  Tetracycline      Phenergan w/ Codeine if Rx  Minocycline      Robitussin (Plain & DM)  Antibiotics:     Crabs/Lice:  Ceclor       RID  Cephalosporins    AVOID:  E-Mycins      Kwell  Keflex  Macrobid/Macrodantin   Diarrhea:  Penicillin      Kao-Pectate  Zithromax      Imodium AD         PUSH FLUIDS AVOID:       Cipro     Fever:  Tetracycline      Tylenol (Regular or Extra  Minocycline       Strength)  Levaquin      Extra Strength-Do not          Exceed 8 tabs/24 hrs Caffeine:        <200mg/day (equiv. To 1 cup of coffee or  approx. 3 12 oz sodas)         Gas: Cold/Hayfever:       Gas-X  Benadryl      Mylicon  Claritin       Phazyme  **Claritin-D        Chlor-Trimeton    Headaches:  Dimetapp      ASA-Free Excedrin  Drixoral-Non-Drowsy     Cold Compress  Mucinex (Guaifenasin)     Tylenol (Regular or Extra  Sudafed/Sudafed-12 Hour     Strength)  **Sudafed PE Pseudoephedrine   Tylenol Cold & Sinus     Vicks Vapor Rub  Zyrtec  **AVOID if Problems With Blood Pressure         Heartburn: Avoid lying down for at least 1 hour after meals  Aciphex      Maalox     Rash:  Milk of Magnesia     Benadryl    Mylanta       1% Hydrocortisone Cream  Pepcid  Pepcid Complete   Sleep Aids:  Prevacid      Ambien   Prilosec       Benadryl  Rolaids       Chamomile Tea  Tums (Limit 4/day)     Unisom  Zantac       Tylenol PM         Warm milk-add vanilla or  Hemorrhoids:       Sugar for taste  Anusol/Anusol H.C.  (RX: Analapram 2.5%)  Sugar Substitutes:  Hydrocortisone OTC     Ok in moderation  Preparation H      Tucks        Vaseline lotion applied to tissue with  wiping    Herpes:     Throat:  Acyclovir      Oragel  Famvir  Valtrex     Vaccines:         Flu Shot Leg Cramps:       *Gardasil  Benadryl      Hepatitis A         Hepatitis B Nasal Spray:         Pneumovax  Saline Nasal Spray     Polio Booster         Tetanus Nausea:       Tuberculosis test or PPD  Vitamin B6 25 mg TID   AVOID:    Dramamine      *Gardasil  Emetrol       Live Poliovirus  Ginger Root 250 mg QID    MMR (measles, mumps &  High Complex Carbs @ Bedtime    rebella)  Sea Bands-Accupressure    Varicella (Chickenpox)  Unisom 1/2 tab TID     *No known complications           If received before Pain:         Known pregnancy;   Darvocet       Resume series after  Lortab        Delivery  Percocet    Yeast:   Tramadol      Femstat  Tylenol 3      Gyne-lotrimin  Ultram       Monistat  Vicodin           MISC:         All Sunscreens           Hair Coloring/highlights          Insect Repellant's          (Including DEET)         Mystic Tans Third Trimester of Pregnancy The third trimester is from week 29 through week 42, months 7 through 9. This trimester is when your unborn baby (fetus) is growing very fast. At the end of the ninth month, the unborn baby is about 20 inches in length. It weighs about 6-10 pounds. Follow these instructions at home:  Avoid all smoking, herbs, and alcohol. Avoid drugs not approved by your doctor.  Do not use any tobacco products, including cigarettes, chewing tobacco, and electronic cigarettes. If you need help quitting, ask your doctor. You may get counseling or other support to help you quit.  Only take medicine as told by your doctor. Some medicines are safe and some are not during pregnancy.  Exercise only as told by your doctor. Stop exercising if you start having cramps.  Eat regular, healthy meals.  Wear a good support bra if your breasts are tender.  Do not use hot tubs, steam rooms, or saunas.  Wear your seat belt when  driving.  Avoid raw meat, uncooked cheese, and liter boxes and soil used by cats.  Take your prenatal vitamins.  Take 1500-2000 milligrams of calcium daily starting at the 20th week of pregnancy until you deliver your baby.  Try taking medicine that helps you poop (stool softener) as needed, and if your doctor approves. Eat more fiber by eating fresh fruit, vegetables, and whole grains. Drink enough fluids to keep your pee (urine) clear or pale yellow.  Take warm water baths (sitz baths) to soothe pain or discomfort caused by hemorrhoids. Use hemorrhoid cream if your doctor approves.  If you have puffy, bulging veins (varicose veins), wear support hose. Raise (elevate) your feet for 15 minutes, 3-4 times a day. Limit salt in your diet.  Avoid heavy lifting, wear low heels, and sit up straight.  Rest with your legs raised if you have leg cramps or low back pain.  Visit your dentist if you have not gone during your pregnancy. Use a soft toothbrush to brush your teeth. Be gentle when you floss.    You can have sex (intercourse) unless your doctor tells you not to.  Do not travel far distances unless you must. Only do so with your doctor's approval.  Take prenatal classes.  Practice driving to the hospital.  Pack your hospital bag.  Prepare the baby's room.  Go to your doctor visits. Get help if:  You are not sure if you are in labor or if your water has broken.  You are dizzy.  You have mild cramps or pressure in your lower belly (abdominal).  You have a nagging pain in your belly area.  You continue to feel sick to your stomach (nauseous), throw up (vomit), or have watery poop (diarrhea).  You have bad smelling fluid coming from your vagina.  You have pain with peeing (urination). Get help right away if:  You have a fever.  You are leaking fluid from your vagina.  You are spotting or bleeding from your vagina.  You have severe belly cramping or pain.  You lose or  gain weight rapidly.  You have trouble catching your breath and have chest pain.  You notice sudden or extreme puffiness (swelling) of your face, hands, ankles, feet, or legs.  You have not felt the baby move in over an hour.  You have severe headaches that do not go away with medicine.  You have vision changes. This information is not intended to replace advice given to you by your health care provider. Make sure you discuss any questions you have with your health care provider. Document Released: 06/12/2009 Document Revised: 08/24/2015 Document Reviewed: 05/19/2012 Elsevier Interactive Patient Education  2017 Elsevier Inc.  

## 2017-09-04 NOTE — Progress Notes (Signed)
[name]-Doing well. Question answered regarding preterm birth. FMLA paperwork submitted to front desk today. Anticipatory guidance regarding 28 week labs and course of prenatal care, will include CMP and protein/creatinine ration due to history of preeclampsia. Encouraged use of abdominal support and compression stockings when working. Reviewed red flag symptoms and when to call. RTC x 3 weeks for 28 week labs and [name] or sooner if needed.

## 2017-09-15 ENCOUNTER — Other Ambulatory Visit: Payer: Self-pay

## 2017-09-15 ENCOUNTER — Observation Stay
Admission: EM | Admit: 2017-09-15 | Discharge: 2017-09-16 | Disposition: A | Discharge status: Home / Self Care | Payer: BLUE CROSS/BLUE SHIELD | Attending: Certified Nurse Midwife | Admitting: Certified Nurse Midwife

## 2017-09-15 DIAGNOSIS — R109 Unspecified abdominal pain: Secondary | ICD-10-CM | POA: Insufficient documentation

## 2017-09-15 DIAGNOSIS — Z3A26 26 weeks gestation of pregnancy: Secondary | ICD-10-CM | POA: Diagnosis not present

## 2017-09-15 DIAGNOSIS — O26892 Other specified pregnancy related conditions, second trimester: Principal | ICD-10-CM | POA: Insufficient documentation

## 2017-09-15 DIAGNOSIS — Z7982 Long term (current) use of aspirin: Secondary | ICD-10-CM | POA: Insufficient documentation

## 2017-09-15 LAB — URINALYSIS, COMPLETE (UACMP) WITH MICROSCOPIC
Bilirubin Urine: NEGATIVE
GLUCOSE, UA: NEGATIVE mg/dL
Hgb urine dipstick: NEGATIVE
KETONES UR: 5 mg/dL — AB
Nitrite: NEGATIVE
PROTEIN: NEGATIVE mg/dL
Specific Gravity, Urine: 1.008 (ref 1.005–1.030)
pH: 6 (ref 5.0–8.0)

## 2017-09-15 LAB — FETAL FIBRONECTIN: Fetal Fibronectin: NEGATIVE

## 2017-09-15 LAB — ROM PLUS (ARMC ONLY): Rom Plus: NEGATIVE

## 2017-09-15 MED ORDER — ACETAMINOPHEN 500 MG PO TABS
1000.0000 mg | ORAL_TABLET | Freq: Four times a day (QID) | ORAL | Status: DC | PRN
  Administered 2017-09-15: 1000 mg via ORAL

## 2017-09-15 MED ORDER — ACETAMINOPHEN 500 MG PO TABS
ORAL_TABLET | ORAL | Status: AC
  Filled 2017-09-15: qty 2

## 2017-09-15 NOTE — OB Triage Note (Signed)
Pt is a 26wk6d G2P1 w/ c/o of LOF. Pt states she started leaking a small amount of fluid around 2030. Pt states she "soaked through two pairs of underwear." Nitrizine inconclusive. Pt states she feels occasional cramping in her lower abdomen and rates her pain a 2/ 10. Pt denies vaginal bleeding and states positive fetal movement. Monitors applied and assessing.

## 2017-09-16 ENCOUNTER — Encounter: Payer: Self-pay | Admitting: Certified Nurse Midwife

## 2017-09-16 DIAGNOSIS — O26892 Other specified pregnancy related conditions, second trimester: Secondary | ICD-10-CM | POA: Diagnosis not present

## 2017-09-16 DIAGNOSIS — Z3A26 26 weeks gestation of pregnancy: Secondary | ICD-10-CM | POA: Diagnosis not present

## 2017-09-16 DIAGNOSIS — O09212 Supervision of pregnancy with history of pre-term labor, second trimester: Secondary | ICD-10-CM | POA: Diagnosis not present

## 2017-09-16 DIAGNOSIS — O09292 Supervision of pregnancy with other poor reproductive or obstetric history, second trimester: Secondary | ICD-10-CM

## 2017-09-16 NOTE — Discharge Instructions (Signed)
Preterm Labor and Birth Information Pregnancy normally lasts 39-41 weeks. Preterm labor is when labor starts early. It starts before you have been pregnant for 37 whole weeks. What are the risk factors for preterm labor? Preterm labor is more likely to occur in women who:  Have an infection while pregnant.  Have a cervix that is short.  Have gone into preterm labor before.  Have had surgery on their cervix.  Are younger than age 29.  Are older than age 50.  Are African American.  Are pregnant with two or more babies.  Take street drugs while pregnant.  Smoke while pregnant.  Do not gain enough weight while pregnant.  Got pregnant right after another pregnancy.  What are the symptoms of preterm labor? Symptoms of preterm labor include:  Cramps. The cramps may feel like the cramps some women get during their period. The cramps may happen with watery poop (diarrhea).  Pain in the belly (abdomen).  Pain in the lower back.  Regular contractions or tightening. It may feel like your belly is getting tighter.  Pressure in the lower belly that seems to get stronger.  More fluid (discharge) leaking from the vagina. The fluid may be watery or bloody.  Water breaking.  Why is it important to notice signs of preterm labor? Babies who are born early may not be fully developed. They have a higher chance for:  Long-term heart problems.  Long-term lung problems.  Trouble controlling body systems, like breathing.  Bleeding in the brain.  A condition called cerebral palsy.  Learning difficulties.  Death.  These risks are highest for babies who are born before 26 weeks of pregnancy. How is preterm labor treated? Treatment depends on:  How long you were pregnant.  Your condition.  The health of your baby.  Treatment may involve:  Having a stitch (suture) placed in your cervix. When you give birth, your cervix opens so the baby can come out. The stitch keeps the  cervix from opening too soon.  Staying at the hospital.  Taking or getting medicines, such as: ? Hormone medicines. ? Medicines to stop contractions. ? Medicines to help the babys lungs develop. ? Medicines to prevent your baby from having cerebral palsy.  What should I do if I am in preterm labor? If you think you are going into labor too soon, call your doctor right away. How can I prevent preterm labor?  Do not use any tobacco products. ? Examples of these are cigarettes, chewing tobacco, and e-cigarettes. ? If you need help quitting, ask your doctor.  Do not use street drugs.  Do not use any medicines unless you ask your doctor if they are safe for you.  Talk with your doctor before taking any herbal supplements.  Make sure you gain enough weight.  Watch for infection. If you think you might have an infection, get it checked right away.  If you have gone into preterm labor before, tell your doctor. This information is not intended to replace advice given to you by your health care provider. Make sure you discuss any questions you have with your health care provider. Document Released: 06/14/2008 Document Revised: 08/29/2015 Document Reviewed: 08/09/2015 Elsevier Interactive Patient Education  2018 [employer].   [employer] of the uterus can occur throughout pregnancy, but they are not always a sign that you are in labor. You may have practice contractions called Braxton Hicks contractions. These false labor contractions are sometimes confused with true labor.  What are [place] contractions? Braxton Hicks contractions are tightening movements that occur in the muscles of the uterus before labor. Unlike true labor contractions, these contractions do not result in opening (dilation) and thinning of the cervix. Toward the end of pregnancy (32-34 weeks), Braxton Hicks contractions can happen more often and may become stronger. These  contractions are sometimes difficult to tell apart from true labor because they can be very uncomfortable. You should not feel embarrassed if you go to the hospital with false labor. Sometimes, the only way to tell if you are in true labor is for your health care provider to look for changes in the cervix. The health care provider will do a physical exam and may monitor your contractions. If you are not in true labor, the exam should show that your cervix is not dilating and your water has not broken. If there are other health problems associated with your pregnancy, it is completely safe for you to be sent home with false labor. You may continue to have Braxton Hicks contractions until you go into true labor. How to tell the difference between true labor and false labor True labor  Contractions last 30-70 seconds.  Contractions become very regular.  Discomfort is usually felt in the top of the uterus, and it spreads to the lower abdomen and low back.  Contractions do not go away with walking.  Contractions usually become more intense and increase in frequency.  The cervix dilates and gets thinner. False labor  Contractions are usually shorter and not as strong as true labor contractions.  Contractions are usually irregular.  Contractions are often felt in the front of the lower abdomen and in the groin.  Contractions may go away when you walk around or change positions while lying down.  Contractions get weaker and are shorter-lasting as time goes on.  The cervix usually does not dilate or become thin. Follow these instructions at home:  Take over-the-counter and prescription medicines only as told by your health care provider.  Keep up with your usual exercises and follow other instructions from your health care provider.  Eat and drink lightly if you think you are going into labor.  If Braxton Hicks contractions are making you uncomfortable: ? Change your position from lying  down or resting to walking, or change from walking to resting. ? Sit and rest in a tub of warm water. ? Drink enough fluid to keep your urine pale yellow. Dehydration may cause these contractions. ? Do slow and deep breathing several times an hour.  Keep all follow-up prenatal visits as told by your health care provider. This is important. Contact a health care provider if:  You have a fever.  You have continuous pain in your abdomen. Get help right away if:  Your contractions become stronger, more regular, and closer together.  You have fluid leaking or gushing from your vagina.  You pass blood-tinged mucus (bloody show).  You have bleeding from your vagina.  You have low back pain that you never had before.  You feel your babys head pushing down and causing pelvic pressure.  Your baby is not moving inside you as much as it used to. Summary  Contractions that occur before labor are called Braxton Hicks contractions, false labor, or practice contractions.  Braxton Hicks contractions are usually shorter, weaker, farther apart, and less regular than true labor contractions. True labor contractions usually become progressively stronger and regular and they become more frequent.  Manage discomfort  from [facility] contractions by changing position, resting in a warm bath, drinking plenty of water, or practicing deep breathing. This information is not intended to replace advice given to you by your health care provider. Make sure you discuss any questions you have with your health care provider. Document Released: 08/01/2016 Document Revised: 08/01/2016 Document Reviewed: 08/01/2016 Elsevier Interactive Patient Education  2018 [employer].

## 2017-09-16 NOTE — Discharge Summary (Signed)
Obstetric Discharge Summary  Patient ID: Toni Mccormick MRN: [id number] DOB/AGE: 04-17-1990 27 y.o.   Date of Admission: 09/15/2017 Toni Mccormick, CNM Toni Nims, MD)  Date of Discharge: 09/16/2017 Toni Mccormick, CNM Toni Nims, MD)  Admitting Diagnosis: Observation at [redacted]w[redacted]d  Secondary Diagnosis: History anxiety, History IBS, History preterm birth, History preeclampsia     Discharge Diagnosis: No other diagnosis   Antepartum Procedures: NST  Brief Hospital Course   L&D OB Triage Note  Toni Mccormick is a 27 y.o. G26P0101 female at [redacted]w[redacted]d, EDD Estimated Date of Delivery: 12/16/17 who presented to triage for complaints of leaking fluid that "soaked two pairs of underwear" and occasional lower abdominal cramping.  She was evaluated by the nurses with no significant findings for preterm rupture of membranes, preterm labor or fetal distress. Vital signs stable. An NST was performed and has been reviewed by CNM. She was treated with Tylenol.   NST INTERPRETATION: Indications: rule out uterine contractions  Mode: External Baseline Rate (A): 155 bpm Variability: Moderate Accelerations: 15 x 15 Decelerations: None Contraction Frequency (min): Rare w/ Uterine Irritability  Impression: reactive  ROM Plus ([facility] only)     Status: None   Collection Time: 09/15/17 10:22 PM  Result Value Ref Range   Rom Plus NEGATIVE     Comment: Performed at [facility], [address] [place], [place] [address]  Fetal fibronectin     Status: Abnormal   Collection Time: 09/15/17 10:23 PM  Result Value Ref Range   Fetal Fibronectin NEGATIVE NEGATIVE   Appearance, FETFIB CLEAR (A) CLEAR    Comment: Performed at [facility], [address] [place], [place] [address]  Urinalysis, Complete w Microscopic     Status: Abnormal   Collection Time: 09/15/17 11:23 PM  Result Value Ref Range   Color, Urine STRAW (A) YELLOW   APPearance HAZY (A) CLEAR   Specific Gravity, Urine  1.008 1.005 - 1.030   pH 6.0 5.0 - 8.0   Glucose, UA NEGATIVE NEGATIVE mg/dL   Hgb urine dipstick NEGATIVE NEGATIVE   Bilirubin Urine NEGATIVE NEGATIVE   Ketones, ur 5 (A) NEGATIVE mg/dL   Protein, ur NEGATIVE NEGATIVE mg/dL   Nitrite NEGATIVE NEGATIVE   Leukocytes, UA TRACE (A) NEGATIVE   RBC / HPF 0-5 0 - 5 RBC/hpf   WBC, UA 6-10 0 - 5 WBC/hpf   Bacteria, UA RARE (A) NONE SEEN   Squamous Epithelial / LPF 0-5 0 - 5   Mucus PRESENT     Comment: Performed at [facility], [address], [place], [place] [address]    Plan: NST performed was reviewed and was found to be reactive. She was discharged home with bleeding/labor precautions.  Continue routine prenatal care. Follow up with CNM as previously scheduled.   Discharge Instructions: Per After Visit Summary.  Activity: Refer to After Visit Summary.  Diet: Regular  Medications:  Allergies as of 09/16/2017   No Known Allergies     Medication List    ASK your doctor about these medications   aspirin 81 MG EC tablet Take 1 tablet (81 mg total) by mouth daily. Swallow whole.   prenatal multivitamin Tabs tablet Take 1 tablet by mouth daily at 12 noon.   ranitidine 150 MG tablet Commonly known as:  ZANTAC Take 150 mg by mouth 2 (two) times daily.      Outpatient follow up:   Discharged Condition: stable  Discharged to: home   Diona Fanti, CNM [facility]

## 2017-09-23 ENCOUNTER — Other Ambulatory Visit: Payer: Self-pay

## 2017-09-23 DIAGNOSIS — Z3483 Encounter for supervision of other normal pregnancy, third trimester: Secondary | ICD-10-CM

## 2017-09-24 ENCOUNTER — Other Ambulatory Visit: Payer: BLUE CROSS/BLUE SHIELD

## 2017-09-24 ENCOUNTER — Encounter: Payer: Self-pay | Admitting: Certified Nurse Midwife

## 2017-09-24 ENCOUNTER — Ambulatory Visit (INDEPENDENT_AMBULATORY_CARE_PROVIDER_SITE_OTHER): Payer: BLUE CROSS/BLUE SHIELD | Admitting: Certified Nurse Midwife

## 2017-09-24 VITALS — BP 109/72 | HR 89 | Wt 199.2 lb

## 2017-09-24 DIAGNOSIS — Z3483 Encounter for supervision of other normal pregnancy, third trimester: Secondary | ICD-10-CM | POA: Diagnosis not present

## 2017-09-24 DIAGNOSIS — Z8759 Personal history of other complications of pregnancy, childbirth and the puerperium: Secondary | ICD-10-CM

## 2017-09-24 DIAGNOSIS — Z131 Encounter for screening for diabetes mellitus: Secondary | ICD-10-CM

## 2017-09-24 DIAGNOSIS — Z3482 Encounter for supervision of other normal pregnancy, second trimester: Secondary | ICD-10-CM

## 2017-09-24 LAB — POCT URINALYSIS DIPSTICK
BILIRUBIN UA: NEGATIVE
GLUCOSE UA: NEGATIVE
KETONES UA: NEGATIVE
Leukocytes, UA: NEGATIVE
Nitrite, UA: NEGATIVE
PH UA: 6 (ref 5.0–8.0)
Protein, UA: NEGATIVE
RBC UA: NEGATIVE
Spec Grav, UA: 1.025 (ref 1.010–1.025)
UROBILINOGEN UA: 0.2 U/dL

## 2017-09-24 MED ORDER — TETANUS-DIPHTH-ACELL PERTUSSIS 5-2.5-18.5 LF-MCG/0.5 IM SUSP
0.5000 mL | Freq: Once | INTRAMUSCULAR | Status: AC
  Administered 2017-09-24: 0.5 mL via INTRAMUSCULAR

## 2017-09-24 NOTE — Progress Notes (Signed)
[name],doing well.  TDAP/GLUCOLA/BTC/RPR/CBC today. Cord blood collection and birth control options discussed, pamphlet given on BC. Patient seen in ED last week concerned of LOF. Reassurance given of increased discharge during pregnancy,red flag symptoms reviewed and when to call. Patient verbalizes understanding. RTC 2 weeks.  Shanika Creacy,SNM/Priscillia Fouch,CNM

## 2017-09-24 NOTE — Patient Instructions (Signed)

## 2017-09-25 LAB — CBC
Hematocrit: 33.7 % — ABNORMAL LOW (ref 34.0–46.6)
Hemoglobin: 11.1 g/dL (ref 11.1–15.9)
MCH: 29.4 pg (ref 26.6–33.0)
MCHC: 32.9 g/dL (ref 31.5–35.7)
MCV: 89 fL (ref 79–97)
PLATELETS: 282 10*3/uL (ref 150–450)
RBC: 3.77 x10E6/uL (ref 3.77–5.28)
RDW: 12.1 % — AB (ref 12.3–15.4)
WBC: 9.6 10*3/uL (ref 3.4–10.8)

## 2017-09-25 LAB — COMPREHENSIVE METABOLIC PANEL
ALBUMIN: 3.3 g/dL — AB (ref 3.5–5.5)
ALK PHOS: 81 IU/L (ref 39–117)
ALT: 16 IU/L (ref 0–32)
AST: 18 IU/L (ref 0–40)
Albumin/Globulin Ratio: 1.2 (ref 1.2–2.2)
BILIRUBIN TOTAL: 0.2 mg/dL (ref 0.0–1.2)
BUN / CREAT RATIO: 13 (ref 9–23)
BUN: 7 mg/dL (ref 6–20)
CHLORIDE: 104 mmol/L (ref 96–106)
CO2: 19 mmol/L — ABNORMAL LOW (ref 20–29)
Calcium: 8.8 mg/dL (ref 8.7–10.2)
Creatinine, Ser: 0.52 mg/dL — ABNORMAL LOW (ref 0.57–1.00)
GFR calc Af Amer: 149 mL/min/{1.73_m2} (ref >59)
GFR calc non Af Amer: 130 mL/min/{1.73_m2} (ref >59)
GLOBULIN, TOTAL: 2.8 g/dL (ref 1.5–4.5)
GLUCOSE: 141 mg/dL — AB (ref 65–99)
POTASSIUM: 4 mmol/L (ref 3.5–5.2)
SODIUM: 138 mmol/L (ref 134–144)
Total Protein: 6.1 g/dL (ref 6.0–8.5)

## 2017-09-25 LAB — PROTEIN / CREATININE RATIO, URINE
Creatinine, Urine: 48 mg/dL
Protein, Ur: 9.1 mg/dL
Protein/Creat Ratio: 190 mg/g creat (ref 0–200)

## 2017-09-25 LAB — RPR: RPR Ser Ql: NONREACTIVE

## 2017-09-25 LAB — GLUCOSE, 1 HOUR GESTATIONAL: Gestational Diabetes Screen: 132 mg/dL (ref 65–139)

## 2017-09-26 ENCOUNTER — Encounter: Payer: Self-pay | Admitting: Certified Nurse Midwife

## 2017-09-30 ENCOUNTER — Encounter: Payer: BLUE CROSS/BLUE SHIELD | Admitting: Obstetrics and Gynecology

## 2017-10-09 ENCOUNTER — Ambulatory Visit (INDEPENDENT_AMBULATORY_CARE_PROVIDER_SITE_OTHER): Payer: BLUE CROSS/BLUE SHIELD | Admitting: Certified Nurse Midwife

## 2017-10-09 VITALS — BP 127/86 | HR 106 | Wt 204.2 lb

## 2017-10-09 DIAGNOSIS — Z3483 Encounter for supervision of other normal pregnancy, third trimester: Secondary | ICD-10-CM | POA: Diagnosis not present

## 2017-10-09 LAB — POCT URINALYSIS DIPSTICK
BILIRUBIN UA: NEGATIVE
Blood, UA: NEGATIVE
Glucose, UA: NEGATIVE
Ketones, UA: NEGATIVE
Leukocytes, UA: NEGATIVE
Nitrite, UA: NEGATIVE
PROTEIN UA: NEGATIVE
Spec Grav, UA: 1.02 (ref 1.010–1.025)
Urobilinogen, UA: 0.2 E.U./dL
pH, UA: 5 (ref 5.0–8.0)

## 2017-10-09 NOTE — Patient Instructions (Signed)
Back Pain in Pregnancy Back pain during pregnancy is common. Back pain may be caused by several factors that are related to changes during your pregnancy. Follow these instructions at home: Managing pain, stiffness, and swelling  If directed, apply ice for sudden (acute) back pain. ? Put ice in a plastic bag. ? Place a towel between your skin and the bag. ? Leave the ice on for 20 minutes, 2-3 times per day.  If directed, apply heat to the affected area before you exercise: ? Place a towel between your skin and the heat pack or heating pad. ? Leave the heat on for 20-30 minutes. ? Remove the heat if your skin turns bright red. This is especially important if you are unable to feel pain, heat, or cold. You may have a greater risk of getting burned. Activity  Exercise as told by your health care provider. Exercising is the best way to prevent or manage back pain.  Listen to your body when lifting. If lifting hurts, ask for help or bend your knees. This uses your leg muscles instead of your back muscles.  Squat down when picking up something from the floor. Do not bend over.  Only use bed rest as told by your health care provider. Bed rest should only be used for the most severe episodes of back pain. Standing, Sitting, and Lying Down  Do not stand in one place for long periods of time.  Use good posture when sitting. Make sure your head rests over your shoulders and is not hanging forward. Use a pillow on your lower back if necessary.  Try sleeping on your side, preferably the left side, with a pillow or two between your legs. If you are sore after a night's rest, your bed may be too soft. A firm mattress may provide more support for your back during pregnancy. General instructions  Do not wear high heels.  Eat a healthy diet. Try to gain weight within your health care provider's recommendations.  Use a maternity girdle, elastic sling, or back brace as told by your health care  provider.  Take over-the-counter and prescription medicines only as told by your health care provider.  Keep all follow-up visits as told by your health care provider. This is important. This includes any visits with any specialists, such as a physical therapist. Contact a health care provider if:  Your back pain interferes with your daily activities.  You have increasing pain in other parts of your body. Get help right away if:  You develop numbness, tingling, weakness, or problems with the use of your arms or legs.  You develop severe back pain that is not controlled with medicine.  You have a sudden change in bowel or bladder control.  You develop shortness of breath, dizziness, or you faint.  You develop nausea, vomiting, or sweating.  You have back pain that is a rhythmic, cramping pain similar to labor pains. Labor pain is usually 1-2 minutes apart, lasts for about 1 minute, and involves a bearing down feeling or pressure in your pelvis.  You have back pain and your water breaks or you have vaginal bleeding.  You have back pain or numbness that travels down your leg.  Your back pain developed after you fell.  You develop pain on one side of your back.  You see blood in your urine.  You develop skin blisters in the area of your back pain. This information is not intended to replace advice given to you   by your health care provider. Make sure you discuss any questions you have with your health care provider. Document Released: 06/26/2005 Document Revised: 08/24/2015 Document Reviewed: 11/30/2014 Elsevier Interactive Patient Education  2018 [employer]. Round Ligament Pain The round ligament is a cord of muscle and tissue that helps to support the uterus. It can become a source of pain during pregnancy if it becomes stretched or twisted as the baby grows. The pain usually begins in the second trimester of pregnancy, and it can come and go until the baby is delivered. It is  not a serious problem, and it does not cause harm to the baby. Round ligament pain is usually a short, sharp, and pinching pain, but it can also be a dull, lingering, and aching pain. The pain is felt in the lower side of the abdomen or in the groin. It usually starts deep in the groin and moves up to the outside of the hip area. Pain can occur with:  A sudden change in position.  Rolling over in bed.  Coughing or sneezing.  Physical activity.  Follow these instructions at home: Watch your condition for any changes. Take these steps to help with your pain:  When the pain starts, relax. Then try: ? Sitting down. ? Flexing your knees up to your abdomen. ? Lying on your side with one pillow under your abdomen and another pillow between your legs. ? Sitting in a warm bath for 15-20 minutes or until the pain goes away.  Take over-the-counter and prescription medicines only as told by your health care provider.  Move slowly when you sit and stand.  Avoid long walks if they cause pain.  Stop or lessen your physical activities if they cause pain.  Contact a health care provider if:  Your pain does not go away with treatment.  You feel pain in your back that you did not have before.  Your medicine is not helping. Get help right away if:  You develop a fever or chills.  You develop uterine contractions.  You develop vaginal bleeding.  You develop nausea or vomiting.  You develop diarrhea.  You have pain when you urinate. This information is not intended to replace advice given to you by your health care provider. Make sure you discuss any questions you have with your health care provider. Document Released: 12/26/2007 Document Revised: 08/24/2015 Document Reviewed: 05/25/2014 Elsevier Interactive Patient Education  [employer].

## 2017-10-09 NOTE — Progress Notes (Signed)
Pt is here for an [place] visit.

## 2017-10-09 NOTE — Progress Notes (Signed)
[name]-Doing well, no questions or concerns. Not focusing on weight gain this pregnancy, but knows that it is less than previous one. Never started ASA for preeclampsia prevention. Reviewed red flag symptoms and when to call. RTC x 2 weeks for [name] or sooner if needed.

## 2017-10-10 ENCOUNTER — Encounter: Payer: Self-pay | Admitting: Certified Nurse Midwife

## 2017-10-10 NOTE — Telephone Encounter (Signed)
Yes, I agree that it is round ligament pain. Encourage abdominal support or use of KT tape. May have PT referral if needed. Thanks, JML

## 2017-10-20 ENCOUNTER — Encounter: Payer: Self-pay | Admitting: Certified Nurse Midwife

## 2017-10-20 NOTE — Telephone Encounter (Signed)
May have note to limit shifts as needed. Thanks JML

## 2017-10-21 ENCOUNTER — Encounter: Payer: Self-pay | Admitting: *Deleted

## 2017-10-27 ENCOUNTER — Ambulatory Visit (INDEPENDENT_AMBULATORY_CARE_PROVIDER_SITE_OTHER): Payer: BLUE CROSS/BLUE SHIELD | Admitting: Certified Nurse Midwife

## 2017-10-27 ENCOUNTER — Encounter: Payer: Self-pay | Admitting: Certified Nurse Midwife

## 2017-10-27 VITALS — BP 128/89 | HR 86 | Wt 206.0 lb

## 2017-10-27 DIAGNOSIS — Z3483 Encounter for supervision of other normal pregnancy, third trimester: Secondary | ICD-10-CM | POA: Diagnosis not present

## 2017-10-27 LAB — POCT URINALYSIS DIPSTICK
Bilirubin, UA: NEGATIVE
Blood, UA: NEGATIVE
Glucose, UA: NEGATIVE
KETONES UA: NEGATIVE
Leukocytes, UA: NEGATIVE
NITRITE UA: NEGATIVE
PH UA: 6 (ref 5.0–8.0)
Protein, UA: NEGATIVE
UROBILINOGEN UA: 0.2 U/dL

## 2017-10-27 NOTE — Patient Instructions (Signed)

## 2017-10-27 NOTE — Progress Notes (Signed)
Body mass index is 34.28 kg/m. [name], doing well. Feels movement. Discussed GBS culture at 36 wks. States she did not have it last time because she delivered early. She is unsure if her son had it stating that he was admitted back in to hospital for 11 days for antibiotics. Discussed potentially doing it early if she has any s & s of PTL. Discussed signs and symptoms of pre-e given her history. She states she has some occasional episodes of epigastric pain.  BP borderline  Repeat 126/89.Reflexes 1+ bilaterally, negaitve clonus. Denies headaches and visual disturbances. Labs today will follow up with results. Follow up 2 wk.   Philip Aspen, CNM

## 2017-10-28 ENCOUNTER — Encounter (INDEPENDENT_AMBULATORY_CARE_PROVIDER_SITE_OTHER): Payer: Self-pay

## 2017-10-28 LAB — COMPREHENSIVE METABOLIC PANEL
A/G RATIO: 1.2 (ref 1.2–2.2)
ALT: 11 IU/L (ref 0–32)
AST: 21 IU/L (ref 0–40)
Albumin: 3.4 g/dL — ABNORMAL LOW (ref 3.5–5.5)
Alkaline Phosphatase: 118 IU/L — ABNORMAL HIGH (ref 39–117)
BILIRUBIN TOTAL: 0.3 mg/dL (ref 0.0–1.2)
BUN/Creatinine Ratio: 13 (ref 9–23)
BUN: 7 mg/dL (ref 6–20)
CO2: 18 mmol/L — ABNORMAL LOW (ref 20–29)
Calcium: 9.3 mg/dL (ref 8.7–10.2)
Chloride: 106 mmol/L (ref 96–106)
Creatinine, Ser: 0.53 mg/dL — ABNORMAL LOW (ref 0.57–1.00)
GFR calc non Af Amer: 129 mL/min/{1.73_m2} (ref >59)
GFR, EST AFRICAN AMERICAN: 148 mL/min/{1.73_m2} (ref >59)
GLUCOSE: 95 mg/dL (ref 65–99)
Globulin, Total: 2.8 g/dL (ref 1.5–4.5)
POTASSIUM: 4.3 mmol/L (ref 3.5–5.2)
Sodium: 138 mmol/L (ref 134–144)
Total Protein: 6.2 g/dL (ref 6.0–8.5)

## 2017-10-28 LAB — PROTEIN / CREATININE RATIO, URINE
CREATININE, UR: 60.4 mg/dL
PROTEIN UR: 17.3 mg/dL
PROTEIN/CREAT RATIO: 286 mg/g{creat} — AB (ref 0–200)

## 2017-10-28 LAB — CBC
Hematocrit: 35.2 % (ref 34.0–46.6)
Hemoglobin: 11.5 g/dL (ref 11.1–15.9)
MCH: 27.8 pg (ref 26.6–33.0)
MCHC: 32.7 g/dL (ref 31.5–35.7)
MCV: 85 fL (ref 79–97)
PLATELETS: 297 10*3/uL (ref 150–450)
RBC: 4.14 x10E6/uL (ref 3.77–5.28)
RDW: 12.5 % (ref 12.3–15.4)
WBC: 9.7 10*3/uL (ref 3.4–10.8)

## 2017-11-02 ENCOUNTER — Other Ambulatory Visit: Payer: Self-pay

## 2017-11-02 ENCOUNTER — Observation Stay
Admission: EM | Admit: 2017-11-02 | Discharge: 2017-11-02 | Disposition: A | Discharge status: Home / Self Care | Payer: BLUE CROSS/BLUE SHIELD | Attending: Certified Nurse Midwife | Admitting: Certified Nurse Midwife

## 2017-11-02 DIAGNOSIS — R030 Elevated blood-pressure reading, without diagnosis of hypertension: Secondary | ICD-10-CM

## 2017-11-02 DIAGNOSIS — R51 Headache: Secondary | ICD-10-CM | POA: Diagnosis not present

## 2017-11-02 DIAGNOSIS — O9989 Other specified diseases and conditions complicating pregnancy, childbirth and the puerperium: Principal | ICD-10-CM | POA: Insufficient documentation

## 2017-11-02 DIAGNOSIS — O09213 Supervision of pregnancy with history of pre-term labor, third trimester: Secondary | ICD-10-CM | POA: Diagnosis not present

## 2017-11-02 DIAGNOSIS — Z3A33 33 weeks gestation of pregnancy: Secondary | ICD-10-CM | POA: Insufficient documentation

## 2017-11-02 DIAGNOSIS — R609 Edema, unspecified: Secondary | ICD-10-CM | POA: Diagnosis not present

## 2017-11-02 LAB — COMPREHENSIVE METABOLIC PANEL
ALK PHOS: 113 U/L (ref 38–126)
ALT: 18 U/L (ref 0–44)
AST: 31 U/L (ref 15–41)
Albumin: 2.7 g/dL — ABNORMAL LOW (ref 3.5–5.0)
Anion gap: 9 (ref 5–15)
BUN: 6 mg/dL (ref 6–20)
CO2: 20 mmol/L — AB (ref 22–32)
CREATININE: 0.47 mg/dL (ref 0.44–1.00)
Calcium: 8.9 mg/dL (ref 8.9–10.3)
Chloride: 108 mmol/L (ref 98–111)
GFR calc Af Amer: >60 mL/min (ref >60)
GFR calc non Af Amer: >60 mL/min (ref >60)
GLUCOSE: 80 mg/dL (ref 70–99)
Potassium: 3.9 mmol/L (ref 3.5–5.1)
SODIUM: 137 mmol/L (ref 135–145)
Total Bilirubin: 0.5 mg/dL (ref 0.3–1.2)
Total Protein: 6.9 g/dL (ref 6.5–8.1)

## 2017-11-02 LAB — CBC
HCT: 36.1 % (ref 35.0–47.0)
HEMOGLOBIN: 12.1 g/dL (ref 12.0–16.0)
MCH: 28.5 pg (ref 26.0–34.0)
MCHC: 33.5 g/dL (ref 32.0–36.0)
MCV: 85.3 fL (ref 80.0–100.0)
PLATELETS: 291 10*3/uL (ref 150–440)
RBC: 4.23 MIL/uL (ref 3.80–5.20)
RDW: 13.4 % (ref 11.5–14.5)
WBC: 10.1 10*3/uL (ref 3.6–11.0)

## 2017-11-02 LAB — FETAL FIBRONECTIN: FETAL FIBRONECTIN: NEGATIVE

## 2017-11-02 LAB — PROTEIN / CREATININE RATIO, URINE
Creatinine, Urine: 21 mg/dL
Total Protein, Urine: <6 mg/dL

## 2017-11-02 MED ORDER — LACTATED RINGERS IV BOLUS
500.0000 mL | Freq: Once | INTRAVENOUS | Status: AC
  Administered 2017-11-02: 500 mL via INTRAVENOUS

## 2017-11-02 MED ORDER — LACTATED RINGERS IV SOLN
INTRAVENOUS | Status: DC
  Administered 2017-11-02: 13:00:00 via INTRAVENOUS

## 2017-11-02 MED ORDER — ACETAMINOPHEN 500 MG PO TABS
ORAL_TABLET | ORAL | Status: AC
  Filled 2017-11-02: qty 2

## 2017-11-02 MED ORDER — BETAMETHASONE SOD PHOS & ACET 6 (3-3) MG/ML IJ SUSP
INTRAMUSCULAR | Status: AC
  Administered 2017-11-02: 12 mg via INTRAMUSCULAR
  Filled 2017-11-02: qty 5

## 2017-11-02 MED ORDER — TERBUTALINE SULFATE 1 MG/ML IJ SOLN
0.2500 mg | Freq: Once | INTRAMUSCULAR | Status: AC
  Administered 2017-11-02: 0.25 mg via SUBCUTANEOUS
  Filled 2017-11-02: qty 1

## 2017-11-02 MED ORDER — BETAMETHASONE SOD PHOS & ACET 6 (3-3) MG/ML IJ SUSP
12.0000 mg | INTRAMUSCULAR | Status: DC
  Administered 2017-11-02: 12 mg via INTRAMUSCULAR

## 2017-11-02 MED ORDER — ACETAMINOPHEN 500 MG PO TABS
1000.0000 mg | ORAL_TABLET | Freq: Four times a day (QID) | ORAL | Status: DC | PRN
  Administered 2017-11-02: 1000 mg via ORAL

## 2017-11-02 NOTE — Discharge Summary (Signed)
Reviewed discharge instructions with patient. Gave the patient opportunity for questions. All questions answered at this time. Patient verbalized understanding. Pt discharged home with her mother.

## 2017-11-02 NOTE — OB Triage Note (Signed)
L&D OB Triage Note  SUBJECTIVE Toni Mccormick is a 27 y.o. G75P0101 female at [redacted]w[redacted]d, EDD Estimated Date of Delivery: 12/16/17 who presented to triage with complaints of headache, elevated BP's at home ,and swelling in her hands. History significant for preterm delivery and pre eclampsia.   OB History  Gravida Para Term Preterm AB Living  2 1 0 1 0 1  SAB TAB Ectopic Multiple Live Births  0 0 0 0 1    # Outcome Date GA Lbr Len/2nd Weight Sex Delivery Anes PTL Lv  2 Current           1 Preterm 04/29/14    M Vag-Spont   LIV    Medications Prior to Admission  Medication Sig Dispense Refill Last Dose  . Prenatal Vit-Fe Fumarate-FA (PRENATAL MULTIVITAMIN) TABS tablet Take 1 tablet by mouth daily at 12 noon.   11/02/2017 at Unknown time  . ranitidine (ZANTAC) 150 MG tablet Take 150 mg by mouth 2 (two) times daily.   11/02/2017 at Unknown time     OBJECTIVE  Nursing Evaluation:   BP 112/62   Pulse 100   Temp 97.7 F (36.5 C) (Oral)   Resp 18   Ht 5\' 5"  (1.651 m)   Wt 206 lb (93.4 kg)   LMP 03/11/2017   SpO2 100%   BMI 34.28 kg/m    Findings: Pre eclampsia labs negative, irritability/contracts 2-3 min NST was performed and has been reviewed by me.  NST INTERPRETATION: Category I  Mode: External Baseline Rate (A): 155 bpm Variability: Moderate Accelerations: 15 x 15 Decelerations: None     Contraction Frequency (min): 1.5-3.5 Contractions slowed with terbutaline and IV hydration  ASSESSMENT Impression:  1.  Pregnancy:  G2P0101 at [redacted]w[redacted]d , EDD Estimated Date of Delivery: 12/16/17 2.  NST:  Category I  3.  Cervic closed/40/ballotable.  4.   FFN negative  CBC Latest Ref Rng & Units 11/02/2017 10/27/2017 09/24/2017  WBC 3.6 - 11.0 K/uL 10.1 9.7 9.6  Hemoglobin 12.0 - 16.0 g/dL 12.1 11.5 11.1  Hematocrit 35.0 - 47.0 % 36.1 35.2 33.7(L)  Platelets 150 - 440 K/uL 291 297 282   CMP Latest Ref Rng & Units 11/02/2017 10/27/2017 09/24/2017  Glucose 70 - 99 mg/dL 80 95 141(H)  BUN 6  - 20 mg/dL 6 7 7   Creatinine 0.44 - 1.00 mg/dL 0.47 0.53(L) 0.52(L)  Sodium 135 - 145 mmol/L 137 138 138  Potassium 3.5 - 5.1 mmol/L 3.9 4.3 4.0  Chloride 98 - 111 mmol/L 108 106 104  CO2 22 - 32 mmol/L 20(L) 18(L) 19(L)  Calcium 8.9 - 10.3 mg/dL 8.9 9.3 8.8  Total Protein 6.5 - 8.1 g/dL 6.9 6.2 6.1  Total Bilirubin 0.3 - 1.2 mg/dL 0.5 0.3 0.2  Alkaline Phos 38 - 126 U/L 113 118(H) 81  AST 15 - 41 U/L 31 21 18   ALT 0 - 44 U/L 18 11 16   Protein / creatinine ratio, urine  Order: [id number]  Status:  Final result Visible to patient:  No (Not Released) Next appt:  11/10/2017 at 08:30 AM in Obstetrics and Gynecology Dani Gobble, CNM)   Ref Range & Units 11:51 28yr ago  Creatinine, Urine mg/dL 21  79.4 R  Total Protein, Urine mg/dL <6    Comment: NO NORMAL RANGE ESTABLISHED FOR THIS TEST  Protein Creatinine Ratio 0.00 - 0.15 mg/mg       Comment: RESULT BELOW REPORTABLE RANGE,  UNABLE TO CALCULATE.  Performed at [facility],  [place], [place] [address]        Fetal fibronectin  Order: [id number]  Status:  Final result Visible to patient:  No (Not Released) Next appt:  11/10/2017 at 08:30 AM in Obstetrics and Gynecology Dani Gobble, CNM)   Ref Range & Units 13:31 63mo ago  Fetal Fibronectin NEGATIVE NEGATIVE  NEGATIVE   Appearance, FETFIB CLEAR CLEAR  CLEARAbnormal  CM        PLAN 1. Reassurance given. Return to hospital tomorrow for 2nd dose betamethasone.  2. Discharge home with standard labor precautions given to return to L&D or call the office for problems. 3. Continue routine prenatal care.  Philip Aspen, CNM     I was present and evaluated the patient in person.

## 2017-11-02 NOTE — OB Triage Note (Signed)
Pt presents c/o increased BPs since yesterday, 132/90, 130's/100. Pt has also reported new swelling in her hands. Pt reports a dull headache rating it a 2/10 on pain scale. Denies epigastric pain. Pt reports positive fetal movement, however its decreased. Denies any bleeding or LOF. Pt is having contractions but states it just feels like "slight tightening".  Pt BP cycling q 24mins, +3 reflexes, no clonus, lungs clear. Will continue to monitor.

## 2017-11-03 ENCOUNTER — Observation Stay
Admission: EM | Admit: 2017-11-03 | Discharge: 2017-11-03 | Disposition: A | Discharge status: Home / Self Care | Payer: BLUE CROSS/BLUE SHIELD | Attending: Certified Nurse Midwife | Admitting: Certified Nurse Midwife

## 2017-11-03 DIAGNOSIS — O09213 Supervision of pregnancy with history of pre-term labor, third trimester: Principal | ICD-10-CM | POA: Insufficient documentation

## 2017-11-03 DIAGNOSIS — Z3A33 33 weeks gestation of pregnancy: Secondary | ICD-10-CM | POA: Diagnosis not present

## 2017-11-03 MED ORDER — BETAMETHASONE SOD PHOS & ACET 6 (3-3) MG/ML IJ SUSP
12.0000 mg | Freq: Once | INTRAMUSCULAR | Status: AC
  Administered 2017-11-03: 12 mg via INTRAMUSCULAR

## 2017-11-03 NOTE — ED Notes (Signed)
..  FIRST NURSE NOTE: Pt presents today for 2nd shot. Pt is to go to [facility] and see [employer]

## 2017-11-03 NOTE — Progress Notes (Signed)
Pt presents from ED for her second BMZ injection today.

## 2017-11-03 NOTE — Progress Notes (Signed)
Pt returned to L&D for second dose of Betamethasone.   Return to clinic as scheduled. PTL precautions reviewed.   Philip Aspen, CNM

## 2017-11-10 ENCOUNTER — Ambulatory Visit (INDEPENDENT_AMBULATORY_CARE_PROVIDER_SITE_OTHER): Payer: BLUE CROSS/BLUE SHIELD | Admitting: Certified Nurse Midwife

## 2017-11-10 ENCOUNTER — Encounter: Payer: Self-pay | Admitting: Certified Nurse Midwife

## 2017-11-10 ENCOUNTER — Other Ambulatory Visit: Payer: Self-pay | Admitting: Certified Nurse Midwife

## 2017-11-10 ENCOUNTER — Telehealth: Payer: Self-pay | Admitting: Certified Nurse Midwife

## 2017-11-10 VITALS — BP 140/90 | HR 100 | Wt 212.4 lb

## 2017-11-10 DIAGNOSIS — O09293 Supervision of pregnancy with other poor reproductive or obstetric history, third trimester: Secondary | ICD-10-CM

## 2017-11-10 DIAGNOSIS — Z113 Encounter for screening for infections with a predominantly sexual mode of transmission: Secondary | ICD-10-CM

## 2017-11-10 DIAGNOSIS — Z8759 Personal history of other complications of pregnancy, childbirth and the puerperium: Secondary | ICD-10-CM

## 2017-11-10 DIAGNOSIS — Z3685 Encounter for antenatal screening for Streptococcus B: Secondary | ICD-10-CM

## 2017-11-10 DIAGNOSIS — O1490 Unspecified pre-eclampsia, unspecified trimester: Secondary | ICD-10-CM | POA: Insufficient documentation

## 2017-11-10 DIAGNOSIS — O163 Unspecified maternal hypertension, third trimester: Secondary | ICD-10-CM

## 2017-11-10 DIAGNOSIS — Z3493 Encounter for supervision of normal pregnancy, unspecified, third trimester: Secondary | ICD-10-CM

## 2017-11-10 DIAGNOSIS — Z3A36 36 weeks gestation of pregnancy: Secondary | ICD-10-CM

## 2017-11-10 DIAGNOSIS — O1493 Unspecified pre-eclampsia, third trimester: Secondary | ICD-10-CM

## 2017-11-10 LAB — PROTEIN / CREATININE RATIO, URINE
Creatinine, Urine: 22.2 mg/dL
Protein, Ur: 8.4 mg/dL
Protein/Creat Ratio: 378 mg/g creat — ABNORMAL HIGH (ref 0–200)

## 2017-11-10 LAB — CBC
HEMATOCRIT: 35.4 % (ref 34.0–46.6)
Hemoglobin: 12 g/dL (ref 11.1–15.9)
MCH: 28.7 pg (ref 26.6–33.0)
MCHC: 33.9 g/dL (ref 31.5–35.7)
MCV: 85 fL (ref 79–97)
Platelets: 280 10*3/uL (ref 150–450)
RBC: 4.18 x10E6/uL (ref 3.77–5.28)
RDW: 13.7 % (ref 12.3–15.4)
WBC: 9.7 10*3/uL (ref 3.4–10.8)

## 2017-11-10 LAB — COMPREHENSIVE METABOLIC PANEL
A/G RATIO: 1.2 (ref 1.2–2.2)
ALBUMIN: 3.4 g/dL — AB (ref 3.5–5.5)
ALT: 11 IU/L (ref 0–32)
AST: 16 IU/L (ref 0–40)
Alkaline Phosphatase: 134 IU/L — ABNORMAL HIGH (ref 39–117)
BUN / CREAT RATIO: 11 (ref 9–23)
BUN: 6 mg/dL (ref 6–20)
Bilirubin Total: 0.2 mg/dL (ref 0.0–1.2)
CALCIUM: 9.8 mg/dL (ref 8.7–10.2)
CO2: 18 mmol/L — ABNORMAL LOW (ref 20–29)
Chloride: 103 mmol/L (ref 96–106)
Creatinine, Ser: 0.53 mg/dL — ABNORMAL LOW (ref 0.57–1.00)
GFR, EST AFRICAN AMERICAN: 148 mL/min/{1.73_m2} (ref >59)
GFR, EST NON AFRICAN AMERICAN: 129 mL/min/{1.73_m2} (ref >59)
GLOBULIN, TOTAL: 2.9 g/dL (ref 1.5–4.5)
Glucose: 91 mg/dL (ref 65–99)
Potassium: 4.1 mmol/L (ref 3.5–5.2)
SODIUM: 135 mmol/L (ref 134–144)
TOTAL PROTEIN: 6.3 g/dL (ref 6.0–8.5)

## 2017-11-10 LAB — POCT URINALYSIS DIPSTICK
Bilirubin, UA: NEGATIVE
Blood, UA: NEGATIVE
GLUCOSE UA: NEGATIVE
Ketones, UA: NEGATIVE
LEUKOCYTES UA: NEGATIVE
Nitrite, UA: NEGATIVE
Protein, UA: NEGATIVE
SPEC GRAV UA: 1.01 (ref 1.010–1.025)
Urobilinogen, UA: 0.2 E.U./dL
pH, UA: 6 (ref 5.0–8.0)

## 2017-11-10 NOTE — Progress Notes (Signed)
[name]-Reports headaches that resolve without medication and facial/foot swelling. History significant for preeclampsia, previous birth at 68 weeks. Will collect [place] labs STAT, see orders. 36 week cultures collected. SVE deferred. Advised kick counts. Reviewed red flag symptoms and when to call; see AVS. RTC x 1 week for [name] or sooner if needed.

## 2017-11-10 NOTE — Patient Instructions (Signed)
Fetal Movement Counts Patient Name: ________________________________________________ Patient Due Date: ____________________ What is a fetal movement count? A fetal movement count is the number of times that you feel your baby move during a certain amount of time. This may also be called a fetal kick count. A fetal movement count is recommended for every pregnant woman. You may be asked to start counting fetal movements as early as week 28 of your pregnancy. Pay attention to when your baby is most active. You may notice your baby's sleep and wake cycles. You may also notice things that make your baby move more. You should do a fetal movement count:  When your baby is normally most active.  At the same time each day.  A good time to count movements is while you are resting, after having something to eat and drink. How do I count fetal movements? 1. Find a quiet, comfortable area. Sit, or lie down on your side. 2. Write down the date, the start time and stop time, and the number of movements that you felt between those two times. Take this information with you to your health care visits. 3. For 2 hours, count kicks, flutters, swishes, rolls, and jabs. You should feel at least 10 movements during 2 hours. 4. You may stop counting after you have felt 10 movements. 5. If you do not feel 10 movements in 2 hours, have something to eat and drink. Then, keep resting and counting for 1 hour. If you feel at least 4 movements during that hour, you may stop counting. Contact a health care provider if:  You feel fewer than 4 movements in 2 hours.  Your baby is not moving like he or she usually does. Date: ____________ Start time: ____________ Stop time: ____________ Movements: ____________ Date: ____________ Start time: ____________ Stop time: ____________ Movements: ____________ Date: ____________ Start time: ____________ Stop time: ____________ Movements: ____________ Date: ____________ Start time:  ____________ Stop time: ____________ Movements: ____________ Date: ____________ Start time: ____________ Stop time: ____________ Movements: ____________ Date: ____________ Start time: ____________ Stop time: ____________ Movements: ____________ Date: ____________ Start time: ____________ Stop time: ____________ Movements: ____________ Date: ____________ Start time: ____________ Stop time: ____________ Movements: ____________ Date: ____________ Start time: ____________ Stop time: ____________ Movements: ____________ This information is not intended to replace advice given to you by your health care provider. Make sure you discuss any questions you have with your health care provider. Document Released: 04/17/2006 Document Revised: 11/15/2015 Document Reviewed: 04/27/2015 Elsevier Interactive Patient Education  2018 [employer] Preeclampsia and Eclampsia Preeclampsia is a serious condition that develops only during pregnancy. It is also called toxemia of pregnancy. This condition causes high blood pressure along with other symptoms, such as swelling and headaches. These symptoms may develop as the condition gets worse. Preeclampsia may occur at 20 weeks of pregnancy or later. Diagnosing and treating preeclampsia early is very important. If not treated early, it can cause serious problems for you and your baby. One problem it can lead to is eclampsia, which is a condition that causes muscle jerking or shaking (convulsions or seizures) in the mother. Delivering your baby is the best treatment for preeclampsia or eclampsia. Preeclampsia and eclampsia symptoms usually go away after your baby is born. What are the causes? The cause of preeclampsia is not known. What increases the risk? The following risk factors make you more likely to develop preeclampsia:  Being pregnant for the first time.  Having had preeclampsia during a past pregnancy.  Having a family history   of preeclampsia.  Having high  blood pressure.  Being pregnant with twins or triplets.  Being 35 or older.  Being African-American.  Having kidney disease or diabetes.  Having medical conditions such as lupus or blood diseases.  Being very overweight (obese).  What are the signs or symptoms? The earliest signs of preeclampsia are:  High blood pressure.  Increased protein in your urine. Your health care provider will check for this at every visit before you give birth (prenatal visit).  Other symptoms that may develop as the condition gets worse include:  Severe headaches.  Sudden weight gain.  Swelling of the hands, face, legs, and feet.  Nausea and vomiting.  Vision problems, such as blurred or double vision.  Numbness in the face, arms, legs, and feet.  Urinating less than usual.  Dizziness.  Slurred speech.  Abdominal pain, especially upper abdominal pain.  Convulsions or seizures.  Symptoms generally go away after giving birth. How is this diagnosed? There are no screening tests for preeclampsia. Your health care provider will ask you about symptoms and check for signs of preeclampsia during your prenatal visits. You may also have tests that include:  Urine tests.  Blood tests.  Checking your blood pressure.  Monitoring your baby's heart rate.  Ultrasound.  How is this treated? You and your health care provider will determine the treatment approach that is best for you. Treatment may include:  Having more frequent prenatal exams to check for signs of preeclampsia, if you have an increased risk for preeclampsia.  Bed rest.  Reducing how much salt (sodium) you eat.  Medicine to lower your blood pressure.  Staying in the hospital, if your condition is severe. There, treatment will focus on controlling your blood pressure and the amount of fluids in your body (fluid retention).  You may need to take medicine (magnesium sulfate) to prevent seizures. This medicine may be given  as an injection or through an IV tube.  Delivering your baby early, if your condition gets worse. You may have your labor started with medicine (induced), or you may have a cesarean delivery.  Follow these instructions at home: Eating and drinking   Drink enough fluid to keep your urine clear or pale yellow.  Eat a healthy diet that is low in sodium. Do not add salt to your food. Check nutrition labels to see how much sodium a food or beverage contains.  Avoid caffeine. Lifestyle  Do not use any products that contain nicotine or tobacco, such as cigarettes and e-cigarettes. If you need help quitting, ask your health care provider.  Do not use alcohol or drugs.  Avoid stress as much as possible. Rest and get plenty of sleep. General instructions  Take over-the-counter and prescription medicines only as told by your health care provider.  When lying down, lie on your side. This keeps pressure off of your baby.  When sitting or lying down, raise (elevate) your feet. Try putting some pillows underneath your lower legs.  Exercise regularly. Ask your health care provider what kinds of exercise are best for you.  Keep all follow-up and prenatal visits as told by your health care provider. This is important. How is this prevented? To prevent preeclampsia or eclampsia from developing during another pregnancy:  Get proper medical care during pregnancy. Your health care provider may be able to prevent preeclampsia or diagnose and treat it early.  Your health care provider may have you take a low-dose aspirin or a calcium supplement during   your next pregnancy.  You may have tests of your blood pressure and kidney function after giving birth.  Maintain a healthy weight. Ask your health care provider for help managing weight gain during pregnancy.  Work with your health care provider to manage any long-term (chronic) health conditions you have, such as diabetes or kidney  problems.  Contact a health care provider if:  You gain more weight than expected.  You have headaches.  You have nausea or vomiting.  You have abdominal pain.  You feel dizzy or light-headed. Get help right away if:  You develop sudden or severe swelling anywhere in your body. This usually happens in the legs.  You gain 5 lbs (2.3 kg) or more during one week.  You have severe: ? Abdominal pain. ? Headaches. ? Dizziness. ? Vision problems. ? Confusion. ? Nausea or vomiting.  You have a seizure.  You have trouble moving any part of your body.  You develop numbness in any part of your body.  You have trouble speaking.  You have any abnormal bleeding.  You pass out. This information is not intended to replace advice given to you by your health care provider. Make sure you discuss any questions you have with your health care provider. Document Released: 03/15/2000 Document Revised: 11/14/2015 Document Reviewed: 10/23/2015 Elsevier Interactive Patient Education  2018 Elsevier Inc.  

## 2017-11-10 NOTE — Progress Notes (Signed)
[name]- pt is having some headaches, Bp elevated, pt is having some pelvic pressure

## 2017-11-10 NOTE — Telephone Encounter (Signed)
1240 Labs results reviewed with Dr. Amalia Hailey. Plan of care discussed.    [id number] call to patient, verified full name and birth. Labs results and plan of care reviewed with patient, verbalized understanding. Reviewed red flag symptoms and when to call. RTC x Thursday for Growth US/BPP, repeat PIH labs, and [name] or sooner if needed. Patient placed on hold and transferred to [facility] to schedule appointments as indicated.   POCT urinalysis dipstick     Status: None   Collection Time: 11/10/17  8:29 AM  Result Value Ref Range   Color, UA pale yellow    Clarity, UA clear    Glucose, UA Negative Negative   Bilirubin, UA neg    Ketones, UA neg    Spec Grav, UA 1.010 1.010 - 1.025   Blood, UA neg    pH, UA 6.0 5.0 - 8.0   Protein, UA Negative Negative   Urobilinogen, UA 0.2 0.2 or 1.0 E.U./dL   Nitrite, UA neg    Leukocytes, UA Negative Negative   Appearance     Odor    CBC     Status: None   Collection Time: 11/10/17  9:03 AM  Result Value Ref Range   WBC 9.7 3.4 - 10.8 x10E3/uL   RBC 4.18 3.77 - 5.28 x10E6/uL   Hemoglobin 12.0 11.1 - 15.9 g/dL   Hematocrit 35.4 34.0 - 46.6 %   MCV 85 79 - 97 fL   MCH 28.7 26.6 - 33.0 pg   MCHC 33.9 31.5 - 35.7 g/dL   RDW 13.7 12.3 - 15.4 %   Platelets 280 150 - 450 x10E3/uL  Comprehensive metabolic panel     Status: Abnormal   Collection Time: 11/10/17  9:03 AM  Result Value Ref Range   Glucose 91 65 - 99 mg/dL   BUN 6 6 - 20 mg/dL   Creatinine, Ser 0.53 (L) 0.57 - 1.00 mg/dL   GFR calc non Af Amer 129 >59 mL/min/1.73   GFR calc Af Amer 148 >59 mL/min/1.73   BUN/Creatinine Ratio 11 9 - 23   Sodium 135 134 - 144 mmol/L   Potassium 4.1 3.5 - 5.2 mmol/L   Chloride 103 96 - 106 mmol/L   CO2 18 (L) 20 - 29 mmol/L   Calcium 9.8 8.7 - 10.2 mg/dL   Total Protein 6.3 6.0 - 8.5 g/dL   Albumin 3.4 (L) 3.5 - 5.5 g/dL   Globulin, Total 2.9 1.5 - 4.5 g/dL   Albumin/Globulin Ratio 1.2 1.2 - 2.2   Bilirubin Total 0.2 0.0 - 1.2 mg/dL   Alkaline  Phosphatase 134 (H) 39 - 117 IU/L   AST 16 0 - 40 IU/L   ALT 11 0 - 32 IU/L  Protein / creatinine ratio, urine     Status: Abnormal   Collection Time: 11/10/17  9:03 AM  Result Value Ref Range   Creatinine, Urine 22.2 Not Estab. mg/dL   Protein, Ur 8.4 Not Estab. mg/dL   Protein/Creat Ratio 378 (H) 0 - 200 mg/g creat    Diona Fanti, CNM [facility], [facility]

## 2017-11-11 ENCOUNTER — Encounter: Payer: Self-pay | Admitting: *Deleted

## 2017-11-11 NOTE — Telephone Encounter (Signed)
Please write note for patient to start leave at this time. Thanks, JML

## 2017-11-12 LAB — GC/CHLAMYDIA PROBE AMP
Chlamydia trachomatis, NAA: NEGATIVE
Neisseria gonorrhoeae by PCR: NEGATIVE

## 2017-11-12 LAB — STREP GP B NAA: Strep Gp B NAA: NEGATIVE

## 2017-11-13 ENCOUNTER — Telehealth: Payer: Self-pay

## 2017-11-13 ENCOUNTER — Ambulatory Visit: Payer: BLUE CROSS/BLUE SHIELD

## 2017-11-13 ENCOUNTER — Ambulatory Visit (INDEPENDENT_AMBULATORY_CARE_PROVIDER_SITE_OTHER): Payer: BLUE CROSS/BLUE SHIELD | Admitting: Certified Nurse Midwife

## 2017-11-13 ENCOUNTER — Ambulatory Visit (INDEPENDENT_AMBULATORY_CARE_PROVIDER_SITE_OTHER): Payer: BLUE CROSS/BLUE SHIELD

## 2017-11-13 VITALS — BP 126/95 | HR 97 | Wt 210.1 lb

## 2017-11-13 DIAGNOSIS — Z3A36 36 weeks gestation of pregnancy: Secondary | ICD-10-CM

## 2017-11-13 DIAGNOSIS — O1493 Unspecified pre-eclampsia, third trimester: Secondary | ICD-10-CM

## 2017-11-13 DIAGNOSIS — O09293 Supervision of pregnancy with other poor reproductive or obstetric history, third trimester: Secondary | ICD-10-CM

## 2017-11-13 LAB — COMPREHENSIVE METABOLIC PANEL
ALBUMIN: 3.4 g/dL — AB (ref 3.5–5.5)
ALK PHOS: 141 IU/L — AB (ref 39–117)
ALT: 12 IU/L (ref 0–32)
AST: 13 IU/L (ref 0–40)
Albumin/Globulin Ratio: 1.2 (ref 1.2–2.2)
BUN/Creatinine Ratio: 14 (ref 9–23)
BUN: 8 mg/dL (ref 6–20)
Bilirubin Total: 0.3 mg/dL (ref 0.0–1.2)
CALCIUM: 9.8 mg/dL (ref 8.7–10.2)
CO2: 16 mmol/L — AB (ref 20–29)
Chloride: 103 mmol/L (ref 96–106)
Creatinine, Ser: 0.57 mg/dL (ref 0.57–1.00)
GFR, EST AFRICAN AMERICAN: 145 mL/min/{1.73_m2} (ref >59)
GFR, EST NON AFRICAN AMERICAN: 126 mL/min/{1.73_m2} (ref >59)
GLOBULIN, TOTAL: 2.9 g/dL (ref 1.5–4.5)
GLUCOSE: 107 mg/dL — AB (ref 65–99)
Potassium: 4 mmol/L (ref 3.5–5.2)
SODIUM: 136 mmol/L (ref 134–144)
TOTAL PROTEIN: 6.3 g/dL (ref 6.0–8.5)

## 2017-11-13 LAB — POCT URINALYSIS DIPSTICK
BILIRUBIN UA: NEGATIVE
Glucose, UA: NEGATIVE
Ketones, UA: NEGATIVE
LEUKOCYTES UA: NEGATIVE
NITRITE UA: NEGATIVE
PH UA: 6 (ref 5.0–8.0)
PROTEIN UA: NEGATIVE
RBC UA: NEGATIVE
Spec Grav, UA: <=1.005 — AB (ref 1.010–1.025)
Urobilinogen, UA: 0.2 E.U./dL

## 2017-11-13 LAB — CBC
HEMATOCRIT: 34.8 % (ref 34.0–46.6)
HEMOGLOBIN: 11.9 g/dL (ref 11.1–15.9)
MCH: 28.5 pg (ref 26.6–33.0)
MCHC: 34.2 g/dL (ref 31.5–35.7)
MCV: 83 fL (ref 79–97)
Platelets: 280 10*3/uL (ref 150–450)
RBC: 4.18 x10E6/uL (ref 3.77–5.28)
RDW: 13.9 % (ref 12.3–15.4)
WBC: 9.9 10*3/uL (ref 3.4–10.8)

## 2017-11-13 LAB — PROTEIN / CREATININE RATIO, URINE
Creatinine, Urine: 13.2 mg/dL
Protein, Ur: <4 mg/dL

## 2017-11-13 NOTE — Telephone Encounter (Signed)
Informed of neg GBS per ML.

## 2017-11-13 NOTE — Progress Notes (Signed)
Pt is here for an [place] visit. Had labs and U/S.

## 2017-11-13 NOTE — Telephone Encounter (Signed)
Informed pt of MLs orders. Also had pt repeat date and time of induction. Pt had no further questions.

## 2017-11-13 NOTE — Patient Instructions (Signed)
Fetal Movement Counts Patient Name: ________________________________________________ Patient Due Date: ____________________ What is a fetal movement count? A fetal movement count is the number of times that you feel your baby move during a certain amount of time. This may also be called a fetal kick count. A fetal movement count is recommended for every pregnant woman. You may be asked to start counting fetal movements as early as week 28 of your pregnancy. Pay attention to when your baby is most active. You may notice your baby's sleep and wake cycles. You may also notice things that make your baby move more. You should do a fetal movement count:  When your baby is normally most active.  At the same time each day.  A good time to count movements is while you are resting, after having something to eat and drink. How do I count fetal movements? 1. Find a quiet, comfortable area. Sit, or lie down on your side. 2. Write down the date, the start time and stop time, and the number of movements that you felt between those two times. Take this information with you to your health care visits. 3. For 2 hours, count kicks, flutters, swishes, rolls, and jabs. You should feel at least 10 movements during 2 hours. 4. You may stop counting after you have felt 10 movements. 5. If you do not feel 10 movements in 2 hours, have something to eat and drink. Then, keep resting and counting for 1 hour. If you feel at least 4 movements during that hour, you may stop counting. Contact a health care provider if:  You feel fewer than 4 movements in 2 hours.  Your baby is not moving like he or she usually does. Date: ____________ Start time: ____________ Stop time: ____________ Movements: ____________ Date: ____________ Start time: ____________ Stop time: ____________ Movements: ____________ Date: ____________ Start time: ____________ Stop time: ____________ Movements: ____________ Date: ____________ Start time:  ____________ Stop time: ____________ Movements: ____________ Date: ____________ Start time: ____________ Stop time: ____________ Movements: ____________ Date: ____________ Start time: ____________ Stop time: ____________ Movements: ____________ Date: ____________ Start time: ____________ Stop time: ____________ Movements: ____________ Date: ____________ Start time: ____________ Stop time: ____________ Movements: ____________ Date: ____________ Start time: ____________ Stop time: ____________ Movements: ____________ This information is not intended to replace advice given to you by your health care provider. Make sure you discuss any questions you have with your health care provider. Document Released: 04/17/2006 Document Revised: 11/15/2015 Document Reviewed: 04/27/2015 Elsevier Interactive Patient Education  2018 [employer] Preeclampsia and Eclampsia Preeclampsia is a serious condition that develops only during pregnancy. It is also called toxemia of pregnancy. This condition causes high blood pressure along with other symptoms, such as swelling and headaches. These symptoms may develop as the condition gets worse. Preeclampsia may occur at 20 weeks of pregnancy or later. Diagnosing and treating preeclampsia early is very important. If not treated early, it can cause serious problems for you and your baby. One problem it can lead to is eclampsia, which is a condition that causes muscle jerking or shaking (convulsions or seizures) in the mother. Delivering your baby is the best treatment for preeclampsia or eclampsia. Preeclampsia and eclampsia symptoms usually go away after your baby is born. What are the causes? The cause of preeclampsia is not known. What increases the risk? The following risk factors make you more likely to develop preeclampsia:  Being pregnant for the first time.  Having had preeclampsia during a past pregnancy.  Having a family history   of preeclampsia.  Having high  blood pressure.  Being pregnant with twins or triplets.  Being 35 or older.  Being African-American.  Having kidney disease or diabetes.  Having medical conditions such as lupus or blood diseases.  Being very overweight (obese).  What are the signs or symptoms? The earliest signs of preeclampsia are:  High blood pressure.  Increased protein in your urine. Your health care provider will check for this at every visit before you give birth (prenatal visit).  Other symptoms that may develop as the condition gets worse include:  Severe headaches.  Sudden weight gain.  Swelling of the hands, face, legs, and feet.  Nausea and vomiting.  Vision problems, such as blurred or double vision.  Numbness in the face, arms, legs, and feet.  Urinating less than usual.  Dizziness.  Slurred speech.  Abdominal pain, especially upper abdominal pain.  Convulsions or seizures.  Symptoms generally go away after giving birth. How is this diagnosed? There are no screening tests for preeclampsia. Your health care provider will ask you about symptoms and check for signs of preeclampsia during your prenatal visits. You may also have tests that include:  Urine tests.  Blood tests.  Checking your blood pressure.  Monitoring your baby's heart rate.  Ultrasound.  How is this treated? You and your health care provider will determine the treatment approach that is best for you. Treatment may include:  Having more frequent prenatal exams to check for signs of preeclampsia, if you have an increased risk for preeclampsia.  Bed rest.  Reducing how much salt (sodium) you eat.  Medicine to lower your blood pressure.  Staying in the hospital, if your condition is severe. There, treatment will focus on controlling your blood pressure and the amount of fluids in your body (fluid retention).  You may need to take medicine (magnesium sulfate) to prevent seizures. This medicine may be given  as an injection or through an IV tube.  Delivering your baby early, if your condition gets worse. You may have your labor started with medicine (induced), or you may have a cesarean delivery.  Follow these instructions at home: Eating and drinking   Drink enough fluid to keep your urine clear or pale yellow.  Eat a healthy diet that is low in sodium. Do not add salt to your food. Check nutrition labels to see how much sodium a food or beverage contains.  Avoid caffeine. Lifestyle  Do not use any products that contain nicotine or tobacco, such as cigarettes and e-cigarettes. If you need help quitting, ask your health care provider.  Do not use alcohol or drugs.  Avoid stress as much as possible. Rest and get plenty of sleep. General instructions  Take over-the-counter and prescription medicines only as told by your health care provider.  When lying down, lie on your side. This keeps pressure off of your baby.  When sitting or lying down, raise (elevate) your feet. Try putting some pillows underneath your lower legs.  Exercise regularly. Ask your health care provider what kinds of exercise are best for you.  Keep all follow-up and prenatal visits as told by your health care provider. This is important. How is this prevented? To prevent preeclampsia or eclampsia from developing during another pregnancy:  Get proper medical care during pregnancy. Your health care provider may be able to prevent preeclampsia or diagnose and treat it early.  Your health care provider may have you take a low-dose aspirin or a calcium supplement during   your next pregnancy.  You may have tests of your blood pressure and kidney function after giving birth.  Maintain a healthy weight. Ask your health care provider for help managing weight gain during pregnancy.  Work with your health care provider to manage any long-term (chronic) health conditions you have, such as diabetes or kidney  problems.  Contact a health care provider if:  You gain more weight than expected.  You have headaches.  You have nausea or vomiting.  You have abdominal pain.  You feel dizzy or light-headed. Get help right away if:  You develop sudden or severe swelling anywhere in your body. This usually happens in the legs.  You gain 5 lbs (2.3 kg) or more during one week.  You have severe: ? Abdominal pain. ? Headaches. ? Dizziness. ? Vision problems. ? Confusion. ? Nausea or vomiting.  You have a seizure.  You have trouble moving any part of your body.  You develop numbness in any part of your body.  You have trouble speaking.  You have any abnormal bleeding.  You pass out. This information is not intended to replace advice given to you by your health care provider. Make sure you discuss any questions you have with your health care provider. Document Released: 03/15/2000 Document Revised: 11/14/2015 Document Reviewed: 10/23/2015 Elsevier Interactive Patient Education  2018 Elsevier Inc.  

## 2017-11-13 NOTE — Progress Notes (Signed)
Please contact patient, results stable. IOL schedule with Sharyn Lull on 11/25/2017 at 0001. Call back with further needs, questions or concerns. Thanks JML

## 2017-11-13 NOTE — Progress Notes (Signed)
Also, let her know that she is GBS negative. Thanks, JML

## 2017-11-13 NOTE — Progress Notes (Signed)
[name]-Not feeling well, would like to work half days until 36 weeks. Encouraged rest and low stress environment. BPP and Growth US WNL, findings reviewed with patient and spouse. Discussed IOL at 37 weeks. Reviewed red flag symptoms and when to call. Continue antenatal testing. RTC x Monday for [facility] labs, NST and [name] or sooner if needed. Induction of labor scheduled: 11/25/2017 at 0001, will notify patient along with lab results.   ULTRASOUND REPORT  Location: [facility] Date of Service:  11/13/2017  Indications: BPP & Growth; Preeclampsia Findings:  Toni Mccormick intrauterine pregnancy is visualized with FHR at 149 BPM. Fetal heart rate ranged from 178 BPM at beginning of exam and down to 149 BPM by end of exam.  Biometrics give an (U/S) Gestational age of 24 2/7 weeks and an (U/S) EDD of 12/09/17; this correlates with the clinically established EDD of 12/16/17.  Fetal presentation is vertex.  EFW: 2970 grams (6lb 9oz).  64th percentile.  AC measures 96th percentile. Placenta: Anterior and grade 2. AFI: 19.4 cm.  BPP: 8/8 with good visualization of fetal movement, tone, breathing, and fluid.  Fetal stomach, kidneys, and bladder appear WNL.  Impression: 1. 36 2/7 week Viable Singleton Intrauterine pregnancy by U/S. 2. (U/S) EDD is consistent with Clinically established (LMP) EDD of 12/16/17. 3. EFW: 2970 grams (6lb 9oz).  64th percentile.  AC measures 96th percentile. 4. BPP: 8/8 5. Fetal heart rate ranged from 178 BPM at beginning of exam to 149 BPM by end of exam.  Recommendations: 1.Clinical correlation with the patient's History and Physical Exam.

## 2017-11-14 ENCOUNTER — Encounter: Payer: Self-pay | Admitting: Certified Nurse Midwife

## 2017-11-17 ENCOUNTER — Other Ambulatory Visit: Payer: Self-pay | Admitting: Certified Nurse Midwife

## 2017-11-17 ENCOUNTER — Other Ambulatory Visit: Payer: BLUE CROSS/BLUE SHIELD

## 2017-11-17 ENCOUNTER — Telehealth: Payer: Self-pay

## 2017-11-17 ENCOUNTER — Ambulatory Visit (INDEPENDENT_AMBULATORY_CARE_PROVIDER_SITE_OTHER): Payer: BLUE CROSS/BLUE SHIELD | Admitting: Certified Nurse Midwife

## 2017-11-17 VITALS — BP 133/79 | HR 90 | Wt 214.2 lb

## 2017-11-17 DIAGNOSIS — O1493 Unspecified pre-eclampsia, third trimester: Secondary | ICD-10-CM | POA: Diagnosis not present

## 2017-11-17 DIAGNOSIS — Z3A36 36 weeks gestation of pregnancy: Secondary | ICD-10-CM | POA: Diagnosis not present

## 2017-11-17 DIAGNOSIS — Z3493 Encounter for supervision of normal pregnancy, unspecified, third trimester: Secondary | ICD-10-CM

## 2017-11-17 DIAGNOSIS — O09293 Supervision of pregnancy with other poor reproductive or obstetric history, third trimester: Secondary | ICD-10-CM

## 2017-11-17 LAB — POCT URINALYSIS DIPSTICK
BILIRUBIN UA: NEGATIVE
GLUCOSE UA: NEGATIVE
KETONES UA: NEGATIVE
Leukocytes, UA: NEGATIVE
Nitrite, UA: NEGATIVE
Protein, UA: NEGATIVE
RBC UA: NEGATIVE
Spec Grav, UA: 1.01 (ref 1.010–1.025)
Urobilinogen, UA: 0.2 E.U./dL
pH, UA: 6.5 (ref 5.0–8.0)

## 2017-11-17 LAB — CBC
HEMATOCRIT: 35 % (ref 34.0–46.6)
Hemoglobin: 12.1 g/dL (ref 11.1–15.9)
MCH: 29.2 pg (ref 26.6–33.0)
MCHC: 34.6 g/dL (ref 31.5–35.7)
MCV: 85 fL (ref 79–97)
PLATELETS: 258 10*3/uL (ref 150–450)
RBC: 4.14 x10E6/uL (ref 3.77–5.28)
RDW: 13.9 % (ref 12.3–15.4)
WBC: 9.2 10*3/uL (ref 3.4–10.8)

## 2017-11-17 LAB — COMPREHENSIVE METABOLIC PANEL
ALBUMIN: 3.2 g/dL — AB (ref 3.5–5.5)
ALK PHOS: 142 IU/L — AB (ref 39–117)
ALT: 9 IU/L (ref 0–32)
AST: 16 IU/L (ref 0–40)
Albumin/Globulin Ratio: 1.1 — ABNORMAL LOW (ref 1.2–2.2)
BUN / CREAT RATIO: 17 (ref 9–23)
BUN: 11 mg/dL (ref 6–20)
CHLORIDE: 104 mmol/L (ref 96–106)
CO2: 16 mmol/L — ABNORMAL LOW (ref 20–29)
Calcium: 9.8 mg/dL (ref 8.7–10.2)
Creatinine, Ser: 0.64 mg/dL (ref 0.57–1.00)
GFR calc Af Amer: 139 mL/min/{1.73_m2} (ref >59)
GFR calc non Af Amer: 121 mL/min/{1.73_m2} (ref >59)
GLOBULIN, TOTAL: 2.9 g/dL (ref 1.5–4.5)
Glucose: 96 mg/dL (ref 65–99)
Potassium: 4 mmol/L (ref 3.5–5.2)
SODIUM: 138 mmol/L (ref 134–144)
TOTAL PROTEIN: 6.1 g/dL (ref 6.0–8.5)

## 2017-11-17 LAB — PROTEIN / CREATININE RATIO, URINE
Creatinine, Urine: 43 mg/dL
PROTEIN UR: 12.6 mg/dL
PROTEIN/CREAT RATIO: 293 mg/g{creat} — AB (ref 0–200)

## 2017-11-17 NOTE — Progress Notes (Signed)
[name]-NST reactive. Reports occasional tension headaches that resolve. No blurred vision or epigastric pain. Repeat [place] labs today. Continue antenatal testing. Reviewed red flag symptoms and when to call. RTC x Thursday for BPP, [name] and repeat labs or sooner if needed.   NST: Reactive, category 1  Baseline: 145 Moderate variability Accelerations present Decelerations  Toco: occasional ctx.

## 2017-11-17 NOTE — Progress Notes (Signed)
Pt is here for an [place] visit. Had NST. Will have labs next.

## 2017-11-17 NOTE — Patient Instructions (Addendum)
Labor Induction Labor induction is when steps are taken to cause a pregnant woman to begin the labor process. Most women go into labor on their own between 37 weeks and 42 weeks of the pregnancy. When this does not happen or when there is a medical need, methods may be used to induce labor. Labor induction causes a pregnant woman's uterus to contract. It also causes the cervix to soften (ripen), open (dilate), and thin out (efface). Usually, labor is not induced before 39 weeks of the pregnancy unless there is a problem with the baby or mother. Before inducing labor, your health care provider will consider a number of factors, including the following:  The medical condition of you and the baby.  How many weeks along you are.  The status of the baby's lung maturity.  The condition of the cervix.  The position of the baby.  What are the reasons for labor induction? Labor may be induced for the following reasons:  The health of the baby or mother is at risk.  The pregnancy is overdue by 1 week or more.  The water breaks but labor does not start on its own.  The mother has a health condition or serious illness, such as high blood pressure, infection, placental abruption, or diabetes.  The amniotic fluid amounts are low around the baby.  The baby is distressed.  Convenience or wanting the baby to be born on a certain date is not a reason for inducing labor. What methods are used for labor induction? Several methods of labor induction may be used, such as:  Prostaglandin medicine. This medicine causes the cervix to dilate and ripen. The medicine will also start contractions. It can be taken by mouth or by inserting a suppository into the vagina.  Inserting a thin tube (catheter) with a balloon on the end into the vagina to dilate the cervix. Once inserted, the balloon is expanded with water, which causes the cervix to open.  Stripping the membranes. Your health care provider separates  amniotic sac tissue from the cervix, causing the cervix to be stretched and causing the release of a hormone called progesterone. This may cause the uterus to contract. It is often done during an office visit. You will be sent home to wait for the contractions to begin. You will then come in for an induction.  Breaking the water. Your health care provider makes a hole in the amniotic sac using a small instrument. Once the amniotic sac breaks, contractions should begin. This may still take hours to see an effect.  Medicine to trigger or strengthen contractions. This medicine is given through an IV access tube inserted into a vein in your arm.  All of the methods of induction, besides stripping the membranes, will be done in the hospital. Induction is done in the hospital so that you and the baby can be carefully monitored. How long does it take for labor to be induced? Some inductions can take up to 2-3 days. Depending on the cervix, it usually takes less time. It takes longer when you are induced early in the pregnancy or if this is your first pregnancy. If a mother is still pregnant and the induction has been going on for 2-3 days, either the mother will be sent home or a cesarean delivery will be needed. What are the risks associated with labor induction? Some of the risks of induction include:  Changes in fetal heart rate, such as too high, too low, or erratic.    Fetal distress.  Chance of infection for the mother and baby.  Increased chance of having a cesarean delivery.  Breaking off (abruption) of the placenta from the uterus (rare).  Uterine rupture (very rare).  When induction is needed for medical reasons, the benefits of induction may outweigh the risks. What are some reasons for not inducing labor? Labor induction should not be done if:  It is shown that your baby does not tolerate labor.  You have had previous surgeries on your uterus, such as a myomectomy or the removal of  fibroids.  Your placenta lies very low in the uterus and blocks the opening of the cervix (placenta previa).  Your baby is not in a head-down position.  The umbilical cord drops down into the birth canal in front of the baby. This could cut off the baby's blood and oxygen supply.  You have had a previous cesarean delivery.  There are unusual circumstances, such as the baby being extremely premature.  This information is not intended to replace advice given to you by your health care provider. Make sure you discuss any questions you have with your health care provider. Document Released: 08/07/2006 Document Revised: 08/24/2015 Document Reviewed: 10/15/2012 Elsevier Interactive Patient Education  2017 [employer] Inc. Fetal Movement Counts Patient Name: ________________________________________________ Patient Due Date: ____________________ What is a fetal movement count? A fetal movement count is the number of times that you feel your baby move during a certain amount of time. This may also be called a fetal kick count. A fetal movement count is recommended for every pregnant woman. You may be asked to start counting fetal movements as early as week 28 of your pregnancy. Pay attention to when your baby is most active. You may notice your baby's sleep and wake cycles. You may also notice things that make your baby move more. You should do a fetal movement count:  When your baby is normally most active.  At the same time each day.  A good time to count movements is while you are resting, after having something to eat and drink. How do I count fetal movements? 1. Find a quiet, comfortable area. Sit, or lie down on your side. 2. Write down the date, the start time and stop time, and the number of movements that you felt between those two times. Take this information with you to your health care visits. 3. For 2 hours, count kicks, flutters, swishes, rolls, and jabs. You should feel at least 10  movements during 2 hours. 4. You may stop counting after you have felt 10 movements. 5. If you do not feel 10 movements in 2 hours, have something to eat and drink. Then, keep resting and counting for 1 hour. If you feel at least 4 movements during that hour, you may stop counting. Contact a health care provider if:  You feel fewer than 4 movements in 2 hours.  Your baby is not moving like he or she usually does. Date: ____________ Start time: ____________ Stop time: ____________ Movements: ____________ Date: ____________ Start time: ____________ Stop time: ____________ Movements: ____________ Date: ____________ Start time: ____________ Stop time: ____________ Movements: ____________ Date: ____________ Start time: ____________ Stop time: ____________ Movements: ____________ Date: ____________ Start time: ____________ Stop time: ____________ Movements: ____________ Date: ____________ Start time: ____________ Stop time: ____________ Movements: ____________ Date: ____________ Start time: ____________ Stop time: ____________ Movements: ____________ Date: ____________ Start time: ____________ Stop time: ____________ Movements: ____________ Date: ____________ Start time: ____________ Stop time: ____________ Movements: ____________   This information is not intended to replace advice given to you by your health care provider. Make sure you discuss any questions you have with your health care provider. Document Released: 04/17/2006 Document Revised: 11/15/2015 Document Reviewed: 04/27/2015 Elsevier Interactive Patient Education  2018 Elsevier Inc.  

## 2017-11-17 NOTE — Telephone Encounter (Signed)
Letter done and given to pt at appt 11/17/17

## 2017-11-18 ENCOUNTER — Observation Stay
Admission: EM | Admit: 2017-11-18 | Discharge: 2017-11-18 | Disposition: A | Discharge status: Home / Self Care | Payer: BLUE CROSS/BLUE SHIELD | Attending: Certified Nurse Midwife | Admitting: Certified Nurse Midwife

## 2017-11-18 ENCOUNTER — Other Ambulatory Visit: Payer: Self-pay

## 2017-11-18 DIAGNOSIS — Z79899 Other long term (current) drug therapy: Secondary | ICD-10-CM | POA: Diagnosis not present

## 2017-11-18 DIAGNOSIS — O1493 Unspecified pre-eclampsia, third trimester: Secondary | ICD-10-CM

## 2017-11-18 DIAGNOSIS — R51 Headache: Secondary | ICD-10-CM | POA: Diagnosis present

## 2017-11-18 DIAGNOSIS — O26893 Other specified pregnancy related conditions, third trimester: Principal | ICD-10-CM | POA: Insufficient documentation

## 2017-11-18 DIAGNOSIS — Z3A36 36 weeks gestation of pregnancy: Secondary | ICD-10-CM | POA: Diagnosis not present

## 2017-11-18 LAB — COMPREHENSIVE METABOLIC PANEL
ALT: 12 U/L (ref 0–44)
ANION GAP: 10 (ref 5–15)
AST: 28 U/L (ref 15–41)
Albumin: 2.6 g/dL — ABNORMAL LOW (ref 3.5–5.0)
Alkaline Phosphatase: 122 U/L (ref 38–126)
BUN: 10 mg/dL (ref 6–20)
CHLORIDE: 109 mmol/L (ref 98–111)
CO2: 19 mmol/L — ABNORMAL LOW (ref 22–32)
Calcium: 9.1 mg/dL (ref 8.9–10.3)
Creatinine, Ser: 0.55 mg/dL (ref 0.44–1.00)
Glucose, Bld: 80 mg/dL (ref 70–99)
POTASSIUM: 4.1 mmol/L (ref 3.5–5.1)
Sodium: 138 mmol/L (ref 135–145)
TOTAL PROTEIN: 6.9 g/dL (ref 6.5–8.1)
Total Bilirubin: 0.4 mg/dL (ref 0.3–1.2)

## 2017-11-18 LAB — TYPE AND SCREEN
ABO/RH(D): A POS
ANTIBODY SCREEN: NEGATIVE

## 2017-11-18 LAB — PROTEIN / CREATININE RATIO, URINE
Creatinine, Urine: 76 mg/dL
PROTEIN CREATININE RATIO: 0.26 mg/mg{creat} — AB (ref 0.00–0.15)
Total Protein, Urine: 20 mg/dL

## 2017-11-18 LAB — CBC
HEMATOCRIT: 35.5 % (ref 35.0–47.0)
HEMOGLOBIN: 11.9 g/dL — AB (ref 12.0–16.0)
MCH: 27.9 pg (ref 26.0–34.0)
MCHC: 33.6 g/dL (ref 32.0–36.0)
MCV: 83.1 fL (ref 80.0–100.0)
Platelets: 245 10*3/uL (ref 150–440)
RBC: 4.28 MIL/uL (ref 3.80–5.20)
RDW: 14.5 % (ref 11.5–14.5)
WBC: 10 10*3/uL (ref 3.6–11.0)

## 2017-11-18 MED ORDER — LACTATED RINGERS IV SOLN
INTRAVENOUS | Status: DC
  Administered 2017-11-18: 19:00:00 via INTRAVENOUS

## 2017-11-18 MED ORDER — ACETAMINOPHEN 500 MG PO TABS
1000.0000 mg | ORAL_TABLET | Freq: Four times a day (QID) | ORAL | Status: DC | PRN
  Administered 2017-11-18: 1000 mg via ORAL
  Filled 2017-11-18: qty 2

## 2017-11-18 MED ORDER — LACTATED RINGERS IV SOLN
500.0000 mL | INTRAVENOUS | Status: DC | PRN
  Administered 2017-11-18: 1000 mL via INTRAVENOUS

## 2017-11-18 NOTE — OB Triage Note (Signed)
L&D OB Triage Note  SUBJECTIVE Toni Mccormick is a 27 y.o. G52P0101 female at [redacted]w[redacted]d, EDD Estimated Date of Delivery: 12/16/17 who presented to triage with complaints of headache that worse that she has had in the past. She feels good fetal movement , denies LOF, or vaginal bleeding and feels occasional contractions.   OB History  Gravida Para Term Preterm AB Living  2 1 0 1 0 1  SAB TAB Ectopic Multiple Live Births  0 0 0 0 1    # Outcome Date GA Lbr Len/2nd Weight Sex Delivery Anes PTL Lv  2 Current           1 Preterm 04/29/14    M Vag-Spont   LIV    Medications Prior to Admission  Medication Sig Dispense Refill Last Dose  . Prenatal Vit-Fe Fumarate-FA (PRENATAL MULTIVITAMIN) TABS tablet Take 1 tablet by mouth daily at 12 noon.   11/18/2017 at Unknown time  . ranitidine (ZANTAC) 150 MG tablet Take 150 mg by mouth 2 (two) times daily.   11/18/2017 at Unknown time     OBJECTIVE  Nursing Evaluation:   BP 129/76   Pulse 89   Temp 98.7 F (37.1 C) (Oral)   Resp 18   Ht 5\' 5"  (1.651 m)   Wt 97.2 kg   LMP 03/11/2017   BMI 35.66 kg/m    Findings:   Reactive NST  NST was performed and has been reviewed by me.  NST INTERPRETATION: Category I  Mode: External Baseline Rate (A): 145 bpm Variability: Moderate Accelerations: 15 x 15 Decelerations: when pt returned from bathroom FHR prolonged decel to 130's x 5 min.  Prolonged monitoring x 3 hrs show reassuring strip category 1 tracing Negative contraction stress test.     Toco: irregular 3-10 min, mild  ASSESSMENT Impression:  1.  Pregnancy:  G2P0101 at [redacted]w[redacted]d , EDD Estimated Date of Delivery: 12/16/17 2.  NST:  Category I  PLAN 1. Reassurance given, reviewed signs and symptoms Pre-E. Pt encouraged to take tylenol for headache and avoid triggers.  2. Discharge home with standard labor precautions given to return to L&D or call the office for problems. 3. Continue routine prenatal care as scheduled on Friday or sooner if  needed.     Dr. Amalia Hailey reviewed strip and consulted on plan of care.   Philip Aspen, CNM

## 2017-11-18 NOTE — Discharge Instructions (Signed)
Come back if:  Big gush of fluids Decreased fetal movement Heavy vaginal bleeding Temp over 100.4 Contractions every 3-5 min lasting at least one hour Blurred/spotty vision Headache that does not resolve with tylenol Swelling in the hands or feet Epigastric pain  Stay well hydrated and get plenty of rest!

## 2017-11-20 ENCOUNTER — Ambulatory Visit (INDEPENDENT_AMBULATORY_CARE_PROVIDER_SITE_OTHER): Payer: BLUE CROSS/BLUE SHIELD

## 2017-11-20 ENCOUNTER — Ambulatory Visit: Payer: BLUE CROSS/BLUE SHIELD

## 2017-11-20 ENCOUNTER — Ambulatory Visit (INDEPENDENT_AMBULATORY_CARE_PROVIDER_SITE_OTHER): Payer: BLUE CROSS/BLUE SHIELD | Admitting: Certified Nurse Midwife

## 2017-11-20 VITALS — BP 132/86 | HR 94 | Wt 218.6 lb

## 2017-11-20 DIAGNOSIS — O403XX0 Polyhydramnios, third trimester, not applicable or unspecified: Secondary | ICD-10-CM

## 2017-11-20 DIAGNOSIS — Z3493 Encounter for supervision of normal pregnancy, unspecified, third trimester: Secondary | ICD-10-CM

## 2017-11-20 DIAGNOSIS — Z3A37 37 weeks gestation of pregnancy: Secondary | ICD-10-CM

## 2017-11-20 DIAGNOSIS — O1493 Unspecified pre-eclampsia, third trimester: Secondary | ICD-10-CM | POA: Diagnosis not present

## 2017-11-20 DIAGNOSIS — O09293 Supervision of pregnancy with other poor reproductive or obstetric history, third trimester: Secondary | ICD-10-CM

## 2017-11-20 DIAGNOSIS — O409XX0 Polyhydramnios, unspecified trimester, not applicable or unspecified: Secondary | ICD-10-CM | POA: Insufficient documentation

## 2017-11-20 LAB — POCT URINALYSIS DIPSTICK OB
Bilirubin, UA: NEGATIVE
GLUCOSE, UA: NEGATIVE — AB
Ketones, UA: NEGATIVE
LEUKOCYTES UA: NEGATIVE
NITRITE UA: NEGATIVE
PH UA: 7 (ref 5.0–8.0)
PROTEIN: NEGATIVE
Spec Grav, UA: <=1.005 — AB (ref 1.010–1.025)
Urobilinogen, UA: 0.2 E.U./dL

## 2017-11-20 NOTE — Progress Notes (Signed)
[name]-NST performed today was reviewed and was found to be reactive. Baseline 145 with moderate variability, accelerations noted, and no decelerations present. BPP: 8/10. Continue recommended antenatal testing. IOL options dicussed with patient and mother, verbalized understanding. SVE: 2-3/50/-3, pink discharge present. Reviewed red flag symptoms and when to call. Present to L&D for IOL on Tuesday or sooner for evaluation if needed.    ULTRASOUND REPORT  Location: [facility] Date of Service:  11/20/2017  Indications: BPP; Preeclampsia Findings:  Toni Mccormick intrauterine pregnancy is visualized with FHR at 157 BPM.   Fetal presentation is vertex.  Placenta: Anterior and grade 2. AFI: Polyhydramnios at 28.7 cm.  BPP: 6/8 (can't award points for fluid - polyhydramnios)  Impression: 1. BPP: 6/8 (-2 for polyhydramnios fluid) 2. AFI: 28.7 cm  Recommendations: 1.Clinical correlation with the patient's History and Physical Exam.

## 2017-11-20 NOTE — Patient Instructions (Signed)
Labor Induction Labor induction is when steps are taken to cause a pregnant woman to begin the labor process. Most women go into labor on their own between 37 weeks and 42 weeks of the pregnancy. When this does not happen or when there is a medical need, methods may be used to induce labor. Labor induction causes a pregnant woman's uterus to contract. It also causes the cervix to soften (ripen), open (dilate), and thin out (efface). Usually, labor is not induced before 39 weeks of the pregnancy unless there is a problem with the baby or mother. Before inducing labor, your health care provider will consider a number of factors, including the following:  The medical condition of you and the baby.  How many weeks along you are.  The status of the baby's lung maturity.  The condition of the cervix.  The position of the baby.  What are the reasons for labor induction? Labor may be induced for the following reasons:  The health of the baby or mother is at risk.  The pregnancy is overdue by 1 week or more.  The water breaks but labor does not start on its own.  The mother has a health condition or serious illness, such as high blood pressure, infection, placental abruption, or diabetes.  The amniotic fluid amounts are low around the baby.  The baby is distressed.  Convenience or wanting the baby to be born on a certain date is not a reason for inducing labor. What methods are used for labor induction? Several methods of labor induction may be used, such as:  Prostaglandin medicine. This medicine causes the cervix to dilate and ripen. The medicine will also start contractions. It can be taken by mouth or by inserting a suppository into the vagina.  Inserting a thin tube (catheter) with a balloon on the end into the vagina to dilate the cervix. Once inserted, the balloon is expanded with water, which causes the cervix to open.  Stripping the membranes. Your health care provider separates  amniotic sac tissue from the cervix, causing the cervix to be stretched and causing the release of a hormone called progesterone. This may cause the uterus to contract. It is often done during an office visit. You will be sent home to wait for the contractions to begin. You will then come in for an induction.  Breaking the water. Your health care provider makes a hole in the amniotic sac using a small instrument. Once the amniotic sac breaks, contractions should begin. This may still take hours to see an effect.  Medicine to trigger or strengthen contractions. This medicine is given through an IV access tube inserted into a vein in your arm.  All of the methods of induction, besides stripping the membranes, will be done in the hospital. Induction is done in the hospital so that you and the baby can be carefully monitored. How long does it take for labor to be induced? Some inductions can take up to 2-3 days. Depending on the cervix, it usually takes less time. It takes longer when you are induced early in the pregnancy or if this is your first pregnancy. If a mother is still pregnant and the induction has been going on for 2-3 days, either the mother will be sent home or a cesarean delivery will be needed. What are the risks associated with labor induction? Some of the risks of induction include:  Changes in fetal heart rate, such as too high, too low, or erratic.    Fetal distress.  Chance of infection for the mother and baby.  Increased chance of having a cesarean delivery.  Breaking off (abruption) of the placenta from the uterus (rare).  Uterine rupture (very rare).  When induction is needed for medical reasons, the benefits of induction may outweigh the risks. What are some reasons for not inducing labor? Labor induction should not be done if:  It is shown that your baby does not tolerate labor.  You have had previous surgeries on your uterus, such as a myomectomy or the removal of  fibroids.  Your placenta lies very low in the uterus and blocks the opening of the cervix (placenta previa).  Your baby is not in a head-down position.  The umbilical cord drops down into the birth canal in front of the baby. This could cut off the baby's blood and oxygen supply.  You have had a previous cesarean delivery.  There are unusual circumstances, such as the baby being extremely premature.  This information is not intended to replace advice given to you by your health care provider. Make sure you discuss any questions you have with your health care provider. Document Released: 08/07/2006 Document Revised: 08/24/2015 Document Reviewed: 10/15/2012 Elsevier Interactive Patient Education  2017 [employer] Fetal Movement Counts Patient Name: ________________________________________________ Patient Due Date: ____________________ What is a fetal movement count? A fetal movement count is the number of times that you feel your baby move during a certain amount of time. This may also be called a fetal kick count. A fetal movement count is recommended for every pregnant woman. You may be asked to start counting fetal movements as early as week 28 of your pregnancy. Pay attention to when your baby is most active. You may notice your baby's sleep and wake cycles. You may also notice things that make your baby move more. You should do a fetal movement count:  When your baby is normally most active.  At the same time each day.  A good time to count movements is while you are resting, after having something to eat and drink. How do I count fetal movements? 1. Find a quiet, comfortable area. Sit, or lie down on your side. 2. Write down the date, the start time and stop time, and the number of movements that you felt between those two times. Take this information with you to your health care visits. 3. For 2 hours, count kicks, flutters, swishes, rolls, and jabs. You should feel at least 10  movements during 2 hours. 4. You may stop counting after you have felt 10 movements. 5. If you do not feel 10 movements in 2 hours, have something to eat and drink. Then, keep resting and counting for 1 hour. If you feel at least 4 movements during that hour, you may stop counting. Contact a health care provider if:  You feel fewer than 4 movements in 2 hours.  Your baby is not moving like he or she usually does. Date: ____________ Start time: ____________ Stop time: ____________ Movements: ____________ Date: ____________ Start time: ____________ Stop time: ____________ Movements: ____________ Date: ____________ Start time: ____________ Stop time: ____________ Movements: ____________ Date: ____________ Start time: ____________ Stop time: ____________ Movements: ____________ Date: ____________ Start time: ____________ Stop time: ____________ Movements: ____________ Date: ____________ Start time: ____________ Stop time: ____________ Movements: ____________ Date: ____________ Start time: ____________ Stop time: ____________ Movements: ____________ Date: ____________ Start time: ____________ Stop time: ____________ Movements: ____________ Date: ____________ Start time: ____________ Stop time: ____________ Movements: ____________   This information is not intended to replace advice given to you by your health care provider. Make sure you discuss any questions you have with your health care provider. Document Released: 04/17/2006 Document Revised: 11/15/2015 Document Reviewed: 04/27/2015 Elsevier Interactive Patient Education  2018 [employer]. Preeclampsia and Eclampsia Preeclampsia is a serious condition that develops only during pregnancy. It is also called toxemia of pregnancy. This condition causes high blood pressure along with other symptoms, such as swelling and headaches. These symptoms may develop as the condition gets worse. Preeclampsia may occur at 20 weeks of pregnancy or  later. Diagnosing and treating preeclampsia early is very important. If not treated early, it can cause serious problems for you and your baby. One problem it can lead to is eclampsia, which is a condition that causes muscle jerking or shaking (convulsions or seizures) in the mother. Delivering your baby is the best treatment for preeclampsia or eclampsia. Preeclampsia and eclampsia symptoms usually go away after your baby is born. What are the causes? The cause of preeclampsia is not known. What increases the risk? The following risk factors make you more likely to develop preeclampsia:  Being pregnant for the first time.  Having had preeclampsia during a past pregnancy.  Having a family history of preeclampsia.  Having high blood pressure.  Being pregnant with twins or triplets.  Being 65 or older.  Being African-American.  Having kidney disease or diabetes.  Having medical conditions such as lupus or blood diseases.  Being very overweight (obese).  What are the signs or symptoms? The earliest signs of preeclampsia are:  High blood pressure.  Increased protein in your urine. Your health care provider will check for this at every visit before you give birth (prenatal visit).  Other symptoms that may develop as the condition gets worse include:  Severe headaches.  Sudden weight gain.  Swelling of the hands, face, legs, and feet.  Nausea and vomiting.  Vision problems, such as blurred or double vision.  Numbness in the face, arms, legs, and feet.  Urinating less than usual.  Dizziness.  Slurred speech.  Abdominal pain, especially upper abdominal pain.  Convulsions or seizures.  Symptoms generally go away after giving birth. How is this diagnosed? There are no screening tests for preeclampsia. Your health care provider will ask you about symptoms and check for signs of preeclampsia during your prenatal visits. You may also have tests that include:  Urine  tests.  Blood tests.  Checking your blood pressure.  Monitoring your baby's heart rate.  Ultrasound.  How is this treated? You and your health care provider will determine the treatment approach that is best for you. Treatment may include:  Having more frequent prenatal exams to check for signs of preeclampsia, if you have an increased risk for preeclampsia.  Bed rest.  Reducing how much salt (sodium) you eat.  Medicine to lower your blood pressure.  Staying in the hospital, if your condition is severe. There, treatment will focus on controlling your blood pressure and the amount of fluids in your body (fluid retention).  You may need to take medicine (magnesium sulfate) to prevent seizures. This medicine may be given as an injection or through an IV tube.  Delivering your baby early, if your condition gets worse. You may have your labor started with medicine (induced), or you may have a cesarean delivery.  Follow these instructions at home: Eating and drinking   Drink enough fluid to keep your urine clear or pale yellow.  Eat a healthy diet that is low in sodium. Do not add salt to your food. Check nutrition labels to see how much sodium a food or beverage contains.  Avoid caffeine. Lifestyle  Do not use any products that contain nicotine or tobacco, such as cigarettes and e-cigarettes. If you need help quitting, ask your health care provider.  Do not use alcohol or drugs.  Avoid stress as much as possible. Rest and get plenty of sleep. General instructions  Take over-the-counter and prescription medicines only as told by your health care provider.  When lying down, lie on your side. This keeps pressure off of your baby.  When sitting or lying down, raise (elevate) your feet. Try putting some pillows underneath your lower legs.  Exercise regularly. Ask your health care provider what kinds of exercise are best for you.  Keep all follow-up and prenatal visits as  told by your health care provider. This is important. How is this prevented? To prevent preeclampsia or eclampsia from developing during another pregnancy:  Get proper medical care during pregnancy. Your health care provider may be able to prevent preeclampsia or diagnose and treat it early.  Your health care provider may have you take a low-dose aspirin or a calcium supplement during your next pregnancy.  You may have tests of your blood pressure and kidney function after giving birth.  Maintain a healthy weight. Ask your health care provider for help managing weight gain during pregnancy.  Work with your health care provider to manage any long-term (chronic) health conditions you have, such as diabetes or kidney problems.  Contact a health care provider if:  You gain more weight than expected.  You have headaches.  You have nausea or vomiting.  You have abdominal pain.  You feel dizzy or light-headed. Get help right away if:  You develop sudden or severe swelling anywhere in your body. This usually happens in the legs.  You gain 5 lbs (2.3 kg) or more during one week.  You have severe: ? Abdominal pain. ? Headaches. ? Dizziness. ? Vision problems. ? Confusion. ? Nausea or vomiting.  You have a seizure.  You have trouble moving any part of your body.  You develop numbness in any part of your body.  You have trouble speaking.  You have any abnormal bleeding.  You pass out. This information is not intended to replace advice given to you by your health care provider. Make sure you discuss any questions you have with your health care provider. Document Released: 03/15/2000 Document Revised: 11/14/2015 Document Reviewed: 10/23/2015 Elsevier Interactive Patient Education  [employer].

## 2017-11-21 ENCOUNTER — Telehealth: Payer: Self-pay

## 2017-11-21 ENCOUNTER — Encounter: Payer: Self-pay | Admitting: Certified Nurse Midwife

## 2017-11-21 LAB — PROTEIN / CREATININE RATIO, URINE
Creatinine, Urine: 23.2 mg/dL
Protein, Ur: 12 mg/dL
Protein/Creat Ratio: 517 mg/g creat — ABNORMAL HIGH (ref 0–200)

## 2017-11-21 LAB — COMPREHENSIVE METABOLIC PANEL
ALBUMIN: 3.2 g/dL — AB (ref 3.5–5.5)
ALK PHOS: 145 IU/L — AB (ref 39–117)
ALT: 12 IU/L (ref 0–32)
AST: 19 IU/L (ref 0–40)
Albumin/Globulin Ratio: 1.1 — ABNORMAL LOW (ref 1.2–2.2)
BUN/Creatinine Ratio: 12 (ref 9–23)
BUN: 7 mg/dL (ref 6–20)
Bilirubin Total: 0.2 mg/dL (ref 0.0–1.2)
CALCIUM: 9.9 mg/dL (ref 8.7–10.2)
CO2: 18 mmol/L — AB (ref 20–29)
Chloride: 104 mmol/L (ref 96–106)
Creatinine, Ser: 0.6 mg/dL (ref 0.57–1.00)
GFR, EST AFRICAN AMERICAN: 142 mL/min/{1.73_m2} (ref >59)
GFR, EST NON AFRICAN AMERICAN: 124 mL/min/{1.73_m2} (ref >59)
GLUCOSE: 71 mg/dL (ref 65–99)
Globulin, Total: 3 g/dL (ref 1.5–4.5)
Potassium: 4.5 mmol/L (ref 3.5–5.2)
SODIUM: 138 mmol/L (ref 134–144)
TOTAL PROTEIN: 6.2 g/dL (ref 6.0–8.5)

## 2017-11-21 LAB — CBC
HEMATOCRIT: 34.9 % (ref 34.0–46.6)
HEMOGLOBIN: 11.5 g/dL (ref 11.1–15.9)
MCH: 27.5 pg (ref 26.6–33.0)
MCHC: 33 g/dL (ref 31.5–35.7)
MCV: 84 fL (ref 79–97)
Platelets: 249 10*3/uL (ref 150–450)
RBC: 4.18 x10E6/uL (ref 3.77–5.28)
RDW: 14 % (ref 12.3–15.4)
WBC: 10.4 10*3/uL (ref 3.4–10.8)

## 2017-11-21 NOTE — Telephone Encounter (Signed)
Spoke with pt- informed her of MLs orders. Pt expressed understanding and will be at L&D at 9 AM.

## 2017-11-21 NOTE — Progress Notes (Signed)
Please contact patient. CBC and CMP are stable. Urine protein increasing. Would like her to come to the birth place on Sunday for repeat labs and NST. What time is good for her? Reviewed red flag symptoms and when to call. Thanks, JML

## 2017-11-23 ENCOUNTER — Observation Stay
Admission: EM | Admit: 2017-11-23 | Discharge: 2017-11-23 | Disposition: A | Discharge status: Home / Self Care | Payer: BLUE CROSS/BLUE SHIELD | Attending: Certified Nurse Midwife | Admitting: Certified Nurse Midwife

## 2017-11-23 ENCOUNTER — Encounter: Payer: Self-pay | Admitting: *Deleted

## 2017-11-23 DIAGNOSIS — O1493 Unspecified pre-eclampsia, third trimester: Secondary | ICD-10-CM | POA: Diagnosis not present

## 2017-11-23 DIAGNOSIS — O1490 Unspecified pre-eclampsia, unspecified trimester: Secondary | ICD-10-CM | POA: Diagnosis present

## 2017-11-23 DIAGNOSIS — Z3A36 36 weeks gestation of pregnancy: Secondary | ICD-10-CM | POA: Diagnosis not present

## 2017-11-23 DIAGNOSIS — O403XX0 Polyhydramnios, third trimester, not applicable or unspecified: Secondary | ICD-10-CM | POA: Insufficient documentation

## 2017-11-23 LAB — CBC
HEMATOCRIT: 34.2 % — AB (ref 35.0–47.0)
Hemoglobin: 11.6 g/dL — ABNORMAL LOW (ref 12.0–16.0)
MCH: 28.1 pg (ref 26.0–34.0)
MCHC: 33.9 g/dL (ref 32.0–36.0)
MCV: 82.7 fL (ref 80.0–100.0)
PLATELETS: 209 10*3/uL (ref 150–440)
RBC: 4.14 MIL/uL (ref 3.80–5.20)
RDW: 14.6 % — ABNORMAL HIGH (ref 11.5–14.5)
WBC: 8.2 10*3/uL (ref 3.6–11.0)

## 2017-11-23 LAB — COMPREHENSIVE METABOLIC PANEL
ALBUMIN: 2.6 g/dL — AB (ref 3.5–5.0)
ALK PHOS: 142 U/L — AB (ref 38–126)
ALT: 18 U/L (ref 0–44)
ANION GAP: 10 (ref 5–15)
AST: 34 U/L (ref 15–41)
BUN: 7 mg/dL (ref 6–20)
CHLORIDE: 105 mmol/L (ref 98–111)
CO2: 20 mmol/L — AB (ref 22–32)
Calcium: 8.9 mg/dL (ref 8.9–10.3)
Creatinine, Ser: 0.7 mg/dL (ref 0.44–1.00)
GFR calc Af Amer: >60 mL/min (ref >60)
GFR calc non Af Amer: >60 mL/min (ref >60)
Glucose, Bld: 133 mg/dL — ABNORMAL HIGH (ref 70–99)
POTASSIUM: 3.8 mmol/L (ref 3.5–5.1)
SODIUM: 135 mmol/L (ref 135–145)
Total Bilirubin: 0.6 mg/dL (ref 0.3–1.2)
Total Protein: 6.6 g/dL (ref 6.5–8.1)

## 2017-11-23 LAB — PROTEIN / CREATININE RATIO, URINE
Creatinine, Urine: 28 mg/dL
Total Protein, Urine: <6 mg/dL

## 2017-11-23 LAB — SAMPLE TO BLOOD BANK

## 2017-11-23 NOTE — OB Triage Note (Signed)
Patient to OBS 4 for NST and PIH workup.  Complains of occasional contractions and pink mucus discharge. No bleeding or LOF.

## 2017-11-23 NOTE — Discharge Summary (Signed)
Obstetric Discharge Summary  Patient ID: Toni Mccormick MRN: [id number] DOB/AGE: [birth year] 27 y.o.   Date of Admission: 11/23/2017  Date of Discharge: 11/23/2017  Admitting Diagnosis: Observation at [redacted]w[redacted]d  Secondary Diagnosis: Preeclampsia     Discharge Diagnosis: No other diagnosis   Antepartum Procedures: NST, SVE, and labs   Brief Hospital Course   L&D OB Triage Note  Toni Mccormick is a 27 y.o. G30P0101 female at [redacted]w[redacted]d, EDD Estimated Date of Delivery: 12/16/17 who presented to triage for NST and labs due to pre-eclampsia and polyhydramnios.  She was evaluated  with no significant findings for fetal distress, labor or severe features of pre-eclampsia. Vital signs stable. An NST was performed and has been reviewed by CNM.   NST INTERPRETATION:  Indications: Pre-eclampsia and Polyhydramnios  Mode: External(monitors removed) Baseline Rate (A): 150 bpm Variability: Moderate Accelerations: 15 x 15 Decelerations: None Nonstress Test Interpretation: Reactive Overall Impression: Reassuring for gestational age Contraction Frequency (min): 2-4  Dilation: 2.5 Effacement (%): 50 Station: -3 Exam by:: Diego Cory CNM  CBC Latest Ref Rng & Units 11/23/2017 11/20/2017 11/18/2017  WBC 3.6 - 11.0 K/uL 8.2 10.4 10.0  Hemoglobin 12.0 - 16.0 g/dL 11.6(L) 11.5 11.9(L)  Hematocrit 35.0 - 47.0 % 34.2(L) 34.9 35.5  Platelets 150 - 440 K/uL 209 249 245   CMP Latest Ref Rng & Units 11/23/2017 11/20/2017 11/18/2017  Glucose 70 - 99 mg/dL 133(H) 71 80  BUN 6 - 20 mg/dL 7 7 10   Creatinine 0.44 - 1.00 mg/dL 0.70 0.60 0.55  Sodium 135 - 145 mmol/L 135 138 138  Potassium 3.5 - 5.1 mmol/L 3.8 4.5 4.1  Chloride 98 - 111 mmol/L 105 104 109  CO2 22 - 32 mmol/L 20(L) 18(L) 19(L)  Calcium 8.9 - 10.3 mg/dL 8.9 9.9 9.1  Total Protein 6.5 - 8.1 g/dL 6.6 6.2 6.9  Total Bilirubin 0.3 - 1.2 mg/dL 0.6 0.2 0.4  Alkaline Phos 38 - 126 U/L 142(H) 145(H) 122  AST 15 - 41 U/L 34 19 28  ALT 0 - 44 U/L 18  12 12    Protein/creatinine ratio: unable to calculate  Plan: NST performed was reviewed and was found to be reactive. She was discharged home with bleeding/labor precautions.  Continue routine prenatal care. Follow up for induction of labor as previously scheduled.   Discharge Instructions: Per After Visit Summary. Activity: Advance as tolerated. Pelvic rest for 6 weeks.  Also refer to After Visit Summary Diet: Regular Medications: Allergies as of 11/23/2017   No Known Allergies     Medication List    ASK your doctor about these medications   prenatal multivitamin Tabs tablet Take 1 tablet by mouth daily at 12 noon.   ranitidine 150 MG tablet Commonly known as:  ZANTAC Take 150 mg by mouth 2 (two) times daily.      Outpatient follow up:  Follow-up Information    [facility] Follow up.   Why:  Follow up for induction of labor on Tuesday AM as previously scheduled or sooner if needed.           Discharged Condition: stable  Discharged to: home   Diona Fanti, CNM [facility], [facility] 11/23/17 3:23 PM

## 2017-11-23 NOTE — Discharge Instructions (Signed)
Please keep your scheduled appointment for induction.  If you have any questions or concerns call your on call provider.  You may also call the nurse's desk at [facility] [phone].  If you feel that you need urgent attention please go to the nearest emergency department.

## 2017-11-24 ENCOUNTER — Inpatient Hospital Stay
Admission: EM | Admit: 2017-11-24 | Discharge: 2017-11-26 | DRG: 807 | Disposition: A | Discharge status: Home / Self Care | Payer: BLUE CROSS/BLUE SHIELD | Attending: Certified Nurse Midwife | Admitting: Certified Nurse Midwife

## 2017-11-24 DIAGNOSIS — K219 Gastro-esophageal reflux disease without esophagitis: Secondary | ICD-10-CM | POA: Diagnosis present

## 2017-11-24 DIAGNOSIS — Z3490 Encounter for supervision of normal pregnancy, unspecified, unspecified trimester: Secondary | ICD-10-CM | POA: Diagnosis present

## 2017-11-24 DIAGNOSIS — O1490 Unspecified pre-eclampsia, unspecified trimester: Secondary | ICD-10-CM | POA: Diagnosis present

## 2017-11-24 DIAGNOSIS — O9962 Diseases of the digestive system complicating childbirth: Secondary | ICD-10-CM | POA: Diagnosis present

## 2017-11-24 DIAGNOSIS — K589 Irritable bowel syndrome without diarrhea: Secondary | ICD-10-CM | POA: Diagnosis present

## 2017-11-24 DIAGNOSIS — O403XX0 Polyhydramnios, third trimester, not applicable or unspecified: Secondary | ICD-10-CM | POA: Diagnosis present

## 2017-11-24 DIAGNOSIS — O1404 Mild to moderate pre-eclampsia, complicating childbirth: Principal | ICD-10-CM | POA: Diagnosis present

## 2017-11-24 DIAGNOSIS — Z3A37 37 weeks gestation of pregnancy: Secondary | ICD-10-CM

## 2017-11-24 DIAGNOSIS — O409XX0 Polyhydramnios, unspecified trimester, not applicable or unspecified: Secondary | ICD-10-CM | POA: Diagnosis present

## 2017-11-25 ENCOUNTER — Other Ambulatory Visit: Payer: Self-pay

## 2017-11-25 ENCOUNTER — Inpatient Hospital Stay: Payer: BLUE CROSS/BLUE SHIELD | Admitting: Anesthesiology

## 2017-11-25 DIAGNOSIS — O9962 Diseases of the digestive system complicating childbirth: Secondary | ICD-10-CM | POA: Diagnosis present

## 2017-11-25 DIAGNOSIS — O403XX0 Polyhydramnios, third trimester, not applicable or unspecified: Secondary | ICD-10-CM | POA: Diagnosis not present

## 2017-11-25 DIAGNOSIS — Z3A37 37 weeks gestation of pregnancy: Secondary | ICD-10-CM | POA: Diagnosis not present

## 2017-11-25 DIAGNOSIS — K589 Irritable bowel syndrome without diarrhea: Secondary | ICD-10-CM | POA: Diagnosis present

## 2017-11-25 DIAGNOSIS — K219 Gastro-esophageal reflux disease without esophagitis: Secondary | ICD-10-CM | POA: Diagnosis present

## 2017-11-25 DIAGNOSIS — Z3490 Encounter for supervision of normal pregnancy, unspecified, unspecified trimester: Secondary | ICD-10-CM | POA: Diagnosis present

## 2017-11-25 DIAGNOSIS — O1404 Mild to moderate pre-eclampsia, complicating childbirth: Secondary | ICD-10-CM | POA: Diagnosis present

## 2017-11-25 DIAGNOSIS — O1494 Unspecified pre-eclampsia, complicating childbirth: Secondary | ICD-10-CM | POA: Diagnosis not present

## 2017-11-25 LAB — COMPREHENSIVE METABOLIC PANEL
ALT: 17 U/L (ref 0–44)
AST: 28 U/L (ref 15–41)
Albumin: 2.7 g/dL — ABNORMAL LOW (ref 3.5–5.0)
Alkaline Phosphatase: 142 U/L — ABNORMAL HIGH (ref 38–126)
Anion gap: 9 (ref 5–15)
BILIRUBIN TOTAL: 0.2 mg/dL — AB (ref 0.3–1.2)
BUN: 8 mg/dL (ref 6–20)
CO2: 20 mmol/L — ABNORMAL LOW (ref 22–32)
Calcium: 8.8 mg/dL — ABNORMAL LOW (ref 8.9–10.3)
Chloride: 109 mmol/L (ref 98–111)
Creatinine, Ser: 0.59 mg/dL (ref 0.44–1.00)
Glucose, Bld: 79 mg/dL (ref 70–99)
POTASSIUM: 3.9 mmol/L (ref 3.5–5.1)
Sodium: 138 mmol/L (ref 135–145)
TOTAL PROTEIN: 6.8 g/dL (ref 6.5–8.1)

## 2017-11-25 LAB — CBC
HCT: 34.7 % — ABNORMAL LOW (ref 35.0–47.0)
Hemoglobin: 11.7 g/dL — ABNORMAL LOW (ref 12.0–16.0)
MCH: 27.7 pg (ref 26.0–34.0)
MCHC: 33.6 g/dL (ref 32.0–36.0)
MCV: 82.4 fL (ref 80.0–100.0)
PLATELETS: 264 10*3/uL (ref 150–440)
RBC: 4.21 MIL/uL (ref 3.80–5.20)
RDW: 14.5 % (ref 11.5–14.5)
WBC: 9.5 10*3/uL (ref 3.6–11.0)

## 2017-11-25 LAB — TYPE AND SCREEN
ABO/RH(D): A POS
Antibody Screen: NEGATIVE

## 2017-11-25 MED ORDER — PHENYLEPHRINE 40 MCG/ML (10ML) SYRINGE FOR IV PUSH (FOR BLOOD PRESSURE SUPPORT)
80.0000 ug | PREFILLED_SYRINGE | INTRAVENOUS | Status: DC | PRN
  Filled 2017-11-25: qty 5

## 2017-11-25 MED ORDER — OXYCODONE-ACETAMINOPHEN 5-325 MG PO TABS: 2.0000 | ORAL_TABLET | ORAL | Status: DC | PRN

## 2017-11-25 MED ORDER — LACTATED RINGERS IV SOLN: 500.0000 mL | Freq: Once | INTRAVENOUS | Status: DC

## 2017-11-25 MED ORDER — SODIUM CHLORIDE 0.9 % IV SOLN: 250.0000 mL | INTRAVENOUS | Status: DC | PRN

## 2017-11-25 MED ORDER — DIPHENHYDRAMINE HCL 50 MG/ML IJ SOLN: 12.5000 mg | INTRAMUSCULAR | Status: DC | PRN

## 2017-11-25 MED ORDER — ACETAMINOPHEN 325 MG PO TABS: 650.0000 mg | ORAL_TABLET | ORAL | Status: DC | PRN

## 2017-11-25 MED ORDER — PRENATAL MULTIVITAMIN CH
1.0000 | ORAL_TABLET | Freq: Every day | ORAL | Status: DC
  Administered 2017-11-25: 1 via ORAL
  Filled 2017-11-25: qty 1

## 2017-11-25 MED ORDER — BUTORPHANOL TARTRATE 1 MG/ML IJ SOLN: 1.0000 mg | INTRAMUSCULAR | Status: DC | PRN

## 2017-11-25 MED ORDER — COCONUT OIL OIL
1.0000 "application " | TOPICAL_OIL | Status: DC | PRN
  Filled 2017-11-25: qty 120

## 2017-11-25 MED ORDER — IBUPROFEN 600 MG PO TABS
600.0000 mg | ORAL_TABLET | Freq: Four times a day (QID) | ORAL | Status: DC
  Administered 2017-11-25 – 2017-11-26 (×3): 600 mg via ORAL
  Filled 2017-11-25 (×3): qty 1

## 2017-11-25 MED ORDER — OXYTOCIN BOLUS FROM INFUSION
500.0000 mL | Freq: Once | INTRAVENOUS | Status: AC
  Administered 2017-11-25: 500 mL via INTRAVENOUS

## 2017-11-25 MED ORDER — FENTANYL 2.5 MCG/ML W/ROPIVACAINE 0.15% IN NS 100 ML EPIDURAL (ARMC)
14.0000 mL/h | EPIDURAL | Status: DC | PRN
  Administered 2017-11-25: 12 mL/h via EPIDURAL

## 2017-11-25 MED ORDER — SODIUM CHLORIDE 0.9 % IV SOLN
INTRAVENOUS | Status: DC | PRN
  Administered 2017-11-25 (×3): 5 mL via EPIDURAL

## 2017-11-25 MED ORDER — OXYTOCIN 40 UNITS IN LACTATED RINGERS INFUSION - SIMPLE MED
1.0000 m[IU]/min | INTRAVENOUS | Status: DC
  Administered 2017-11-25: 2 m[IU]/min via INTRAVENOUS
  Administered 2017-11-25: 10 m[IU]/min via INTRAVENOUS
  Filled 2017-11-25: qty 1000

## 2017-11-25 MED ORDER — LACTATED RINGERS IV SOLN
INTRAVENOUS | Status: DC
  Administered 2017-11-25 (×2): via INTRAVENOUS

## 2017-11-25 MED ORDER — EPHEDRINE 5 MG/ML INJ
10.0000 mg | INTRAVENOUS | Status: DC | PRN
  Filled 2017-11-25: qty 2

## 2017-11-25 MED ORDER — SODIUM CHLORIDE 0.9% FLUSH
3.0000 mL | Freq: Two times a day (BID) | INTRAVENOUS | Status: DC
  Administered 2017-11-26: 3 mL via INTRAVENOUS

## 2017-11-25 MED ORDER — SENNOSIDES-DOCUSATE SODIUM 8.6-50 MG PO TABS
2.0000 | ORAL_TABLET | ORAL | Status: DC
  Administered 2017-11-26: 2 via ORAL
  Filled 2017-11-25: qty 2

## 2017-11-25 MED ORDER — ONDANSETRON HCL 4 MG/2ML IJ SOLN: 4.0000 mg | INTRAMUSCULAR | Status: DC | PRN

## 2017-11-25 MED ORDER — OXYTOCIN 40 UNITS IN LACTATED RINGERS INFUSION - SIMPLE MED
2.5000 [IU]/h | INTRAVENOUS | Status: DC
  Administered 2017-11-25: 2.5 [IU]/h via INTRAVENOUS
  Filled 2017-11-25: qty 1000

## 2017-11-25 MED ORDER — DIBUCAINE 1 % RE OINT: 1.0000 "application " | TOPICAL_OINTMENT | RECTAL | Status: DC | PRN

## 2017-11-25 MED ORDER — OXYCODONE-ACETAMINOPHEN 5-325 MG PO TABS: 1.0000 | ORAL_TABLET | ORAL | Status: DC | PRN

## 2017-11-25 MED ORDER — ONDANSETRON HCL 4 MG PO TABS: 4.0000 mg | ORAL_TABLET | ORAL | Status: DC | PRN

## 2017-11-25 MED ORDER — LIDOCAINE HCL (PF) 1 % IJ SOLN
INTRAMUSCULAR | Status: DC | PRN
  Administered 2017-11-25: 1 mL

## 2017-11-25 MED ORDER — WITCH HAZEL-GLYCERIN EX PADS: 1.0000 "application " | MEDICATED_PAD | CUTANEOUS | Status: DC | PRN

## 2017-11-25 MED ORDER — ZOLPIDEM TARTRATE 5 MG PO TABS: 5.0000 mg | ORAL_TABLET | Freq: Every evening | ORAL | Status: DC | PRN

## 2017-11-25 MED ORDER — LIDOCAINE HCL (PF) 1 % IJ SOLN: 30.0000 mL | INTRAMUSCULAR | Status: DC | PRN

## 2017-11-25 MED ORDER — ONDANSETRON HCL 4 MG/2ML IJ SOLN: 4.0000 mg | Freq: Four times a day (QID) | INTRAMUSCULAR | Status: DC | PRN

## 2017-11-25 MED ORDER — LACTATED RINGERS IV SOLN: 500.0000 mL | INTRAVENOUS | Status: DC | PRN

## 2017-11-25 MED ORDER — SOD CITRATE-CITRIC ACID 500-334 MG/5ML PO SOLN: 30.0000 mL | ORAL | Status: DC | PRN

## 2017-11-25 MED ORDER — BENZOCAINE-MENTHOL 20-0.5 % EX AERO
1.0000 "application " | INHALATION_SPRAY | CUTANEOUS | Status: DC | PRN
  Filled 2017-11-25: qty 56

## 2017-11-25 MED ORDER — TERBUTALINE SULFATE 1 MG/ML IJ SOLN: 0.2500 mg | Freq: Once | INTRAMUSCULAR | Status: DC | PRN

## 2017-11-25 MED ORDER — SODIUM CHLORIDE 0.9% FLUSH: 3.0000 mL | INTRAVENOUS | Status: DC | PRN

## 2017-11-25 MED ORDER — DIPHENHYDRAMINE HCL 25 MG PO CAPS: 25.0000 mg | ORAL_CAPSULE | Freq: Four times a day (QID) | ORAL | Status: DC | PRN

## 2017-11-25 MED ORDER — FENTANYL 2.5 MCG/ML W/ROPIVACAINE 0.15% IN NS 100 ML EPIDURAL (ARMC)
EPIDURAL | Status: AC
  Filled 2017-11-25: qty 100

## 2017-11-25 MED ORDER — SIMETHICONE 80 MG PO CHEW: 80.0000 mg | CHEWABLE_TABLET | ORAL | Status: DC | PRN

## 2017-11-25 MED ORDER — ACETAMINOPHEN 325 MG PO TABS
650.0000 mg | ORAL_TABLET | ORAL | Status: DC | PRN
  Administered 2017-11-25 – 2017-11-26 (×3): 650 mg via ORAL
  Filled 2017-11-25 (×3): qty 2

## 2017-11-25 NOTE — H&P (Signed)
Obstetric History and Physical  Toni Mccormick is a 27 y.o. [phone] with IUP at [redacted]w[redacted]d presenting for induction of labor due pre-eclampsia and polyhdramnios.   Patient states she has been having  Irregular minutes contractions, none vaginal bleeding, intact membranes, with active fetal movement.    Denies difficulty breathing or respiratory distress, chest pain, abdominal pain, dysuria, and leg swelling or pain.   Prenatal Course  Source of Care: [facility]-initial visit at 8 wks, total visits: 13  Pregnancy complications or risks: Pre-eclampsia, Polyhydramnios, Emetophobia, History of IBS, History of PTB-declines 17P  Prenatal labs and studies:  ABO, Rh: --/--/A POS (08/27 0133)  Antibody: NEG (08/27 0133)  Rubella: 2.96 (02/08 1519)  Varicella: 2, 843 (02/08 1519)  RPR: Non Reactive (06/26 0902)   HBsAg: Negative (02/08 1519)   HIV: Non Reactive (02/08 1519)   YYT:KPTWSFKC (08/12 0850)  1 hr Glucola: 132 (06/26 0902)  Genetic screening: Declined   Anatomy US: Complete, normal (5/10 1302)  Past Medical History:  Diagnosis Date  . Chicken pox   . Gestational hypertension   . IBS (irritable bowel syndrome)   . Post partum depression     Past Surgical History:  Procedure Laterality Date  . TONSILLECTOMY AND ADENOIDECTOMY Bilateral 1992  . WISDOM TOOTH EXTRACTION      OB History  Gravida Para Term Preterm AB Living  2 2 1 1   2   SAB TAB Ectopic Multiple Live Births        0 2    # Outcome Date GA Lbr Len/2nd Weight Sex Delivery Anes PTL Lv  2 Term 11/25/17 [redacted]w[redacted]d 04:42 / 00:09 3260 g  Vag-Spont EPI  LIV  1 Preterm 04/29/14    M Vag-Spont   LIV    Social History   Socioeconomic History  . Marital status: Married    Spouse name: Not on file  . Number of children: Not on file  . Years of education: Not on file  . Highest education level: Not on file  Occupational History  . Not on file  Social Needs  . Financial resource strain: Not on file  . Food  insecurity:    Worry: Not on file    Inability: Not on file  . Transportation needs:    Medical: Not on file    Non-medical: Not on file  Tobacco Use  . Smoking status: Never Smoker  . Smokeless tobacco: Never Used  Substance and Sexual Activity  . Alcohol use: Not Currently    Frequency: Never    Comment: RARE  . Drug use: No  . Sexual activity: Yes    Birth control/protection: Pill  Lifestyle  . Physical activity:    Days per week: Not on file    Minutes per session: Not on file  . Stress: Not on file  Relationships  . Social connections:    Talks on phone: Not on file    Gets together: Not on file    Attends religious service: Not on file    Active member of club or organization: Not on file    Attends meetings of clubs or organizations: Not on file    Relationship status: Not on file  Other Topics Concern  . Not on file  Social History Narrative  . Not on file    Family History  Problem Relation Age of Onset  . Hyperlipidemia Father   . Hypertension Father   . Hyperlipidemia Maternal Grandmother   . Cancer Maternal Grandfather  lung  . Diabetes Paternal Grandmother   . Arthritis Paternal Grandfather   . Cancer Paternal Grandfather        lung    Medications Prior to Admission  Medication Sig Dispense Refill Last Dose  . Prenatal Vit-Fe Fumarate-FA (PRENATAL MULTIVITAMIN) TABS tablet Take 1 tablet by mouth daily at 12 noon.   11/23/2017 at Unknown time  . ranitidine (ZANTAC) 150 MG tablet Take 150 mg by mouth 2 (two) times daily.   11/23/2017 at Unknown time    No Known Allergies  Review of Systems: Negative except for what is mentioned in HPI.  Physical Exam:  Temp:  [97.6 F (36.4 C)-98 F (36.7 C)] 98 F (36.7 C) (08/27 0723) Pulse Rate:  [80-247] 119 (08/27 0727) Resp:  [18] 18 (08/27 0021) BP: (82-140)/(39-107) 129/69 (08/27 7106) Weight:  [99.4 kg] 99.4 kg (08/27 0023)  GENERAL: Well-developed, well-nourished female in no acute  distress.   LUNGS: Clear to auscultation bilaterally.   HEART: Regular rate and rhythm.  ABDOMEN: Soft, nontender, nondistended, gravid.  EXTREMITIES: Nontender, no edema, 2+ distal pulses.  Cervical Exam: Dilation: 10 Dilation Complete Date: 11/25/17 Dilation Complete Time: 0750 Effacement (%): 100 Station: 0, Plus 1 Exam by:: Zerrick Hanssen CNM  FHT:  Baseline rate 155 bpm   Variability moderate  Accelerations present   Decelerations none  Contractions: Every one (1) to four (4) minutes, soft resting tone   Pertinent Labs/Studies:   Results for orders placed or performed during the hospital encounter of 11/24/17 (from the past 24 hour(s))  CBC     Status: Abnormal   Collection Time: 11/25/17  1:05 AM  Result Value Ref Range   WBC 9.5 3.6 - 11.0 K/uL   RBC 4.21 3.80 - 5.20 MIL/uL   Hemoglobin 11.7 (L) 12.0 - 16.0 g/dL   HCT 34.7 (L) 35.0 - 47.0 %   MCV 82.4 80.0 - 100.0 fL   MCH 27.7 26.0 - 34.0 pg   MCHC 33.6 32.0 - 36.0 g/dL   RDW 14.5 11.5 - 14.5 %   Platelets 264 150 - 440 K/uL  Comprehensive metabolic panel     Status: Abnormal   Collection Time: 11/25/17  1:05 AM  Result Value Ref Range   Sodium 138 135 - 145 mmol/L   Potassium 3.9 3.5 - 5.1 mmol/L   Chloride 109 98 - 111 mmol/L   CO2 20 (L) 22 - 32 mmol/L   Glucose, Bld 79 70 - 99 mg/dL   BUN 8 6 - 20 mg/dL   Creatinine, Ser 0.59 0.44 - 1.00 mg/dL   Calcium 8.8 (L) 8.9 - 10.3 mg/dL   Total Protein 6.8 6.5 - 8.1 g/dL   Albumin 2.7 (L) 3.5 - 5.0 g/dL   AST 28 15 - 41 U/L   ALT 17 0 - 44 U/L   Alkaline Phosphatase 142 (H) 38 - 126 U/L   Total Bilirubin 0.2 (L) 0.3 - 1.2 mg/dL   GFR calc non Af Amer >60 >60 mL/min   GFR calc Af Amer >60 >60 mL/min   Anion gap 9 5 - 15  Type and screen     Status: None   Collection Time: 11/25/17  1:33 AM  Result Value Ref Range   ABO/RH(D) A POS    Antibody Screen NEG    Sample Expiration      11/28/2017 Performed at [facility], [address], [place],  [place] [address]     Assessment :  Toni Mccormick is  a 27 y.o. G2P1102 at [redacted]w[redacted]d being admitted for induction of labor due to pre-eclampsia and polyhydramnios, status post betamethasone, GBS negative  FHR Category I  Plan:  Admit to birthing suites, see orders.   Labor: Expectant management.    Induction/Augmentation as needed, per protocol.  Delivery plan: Hopeful for vaginal delivery.   Dr. Marcelline Mates notified of admission and plan of care.    Diona Fanti, CNM [facility], [facility] 11/25/17 8:24 AM

## 2017-11-25 NOTE — Anesthesia Preprocedure Evaluation (Signed)
Anesthesia Evaluation  Patient identified by MRN, date of birth, ID band Patient awake    Reviewed: Allergy & Precautions, NPO status , Patient's Chart, lab work & pertinent test results  Airway Mallampati: III  TM Distance: >3 FB     Dental  (+) Teeth Intact   Pulmonary neg pulmonary ROS,           Cardiovascular hypertension, + Peripheral Vascular Disease  negative cardio ROS       Neuro/Psych PSYCHIATRIC DISORDERS Anxiety Depression negative neurological ROS  negative psych ROS   GI/Hepatic negative GI ROS, Neg liver ROS, GERD  ,  Endo/Other  negative endocrine ROS  Renal/GU negative Renal ROS  negative genitourinary   Musculoskeletal negative musculoskeletal ROS (+)   Abdominal   Peds negative pediatric ROS (+)  Hematology negative hematology ROS (+)   Anesthesia Other Findings   Reproductive/Obstetrics negative OB ROS                             Anesthesia Physical Anesthesia Plan  ASA: III  Anesthesia Plan: Epidural   Post-op Pain Management:    Induction:   PONV Risk Score and Plan:   Airway Management Planned:   Additional Equipment:   Intra-op Plan:   Post-operative Plan:   Informed Consent:   Plan Discussed with:   Anesthesia Plan Comments:         Anesthesia Quick Evaluation

## 2017-11-25 NOTE — Lactation Note (Signed)
This note was copied from a baby's chart. Lactation Consultation Note  Patient Name: Toni Mccormick [id number] Date: 11/25/2017 Reason for consult: Initial assessment   Maternal Data    Feeding Feeding Type: Breast Fed Length of feed: 30 min(per mom)  LATCH Score Latch: Grasps breast easily, tongue down, lips flanged, rhythmical sucking.  Audible Swallowing: A few with stimulation  Type of Nipple: Everted at rest and after stimulation  Comfort (Breast/Nipple): Soft / non-tender  Hold (Positioning): Assistance needed to correctly position infant at breast and maintain latch.  LATCH Score: 8  Interventions Interventions: Assisted with latch;Adjust position;Support pillows  Lactation Tools Discussed/Used     Consult Status Consult Status: PRN LC assisted with latch of infant to breast. After trying a couple positions, infant was able to latch using football hold on the left breast. Mother states that her nipples are slightly sore. Infant's lips are flanged. Nurse will bring mother coconut oil for sore nipples. Mother instructed to call for lactation assistance as needed.   Elvera Lennox 11/26/32, 3:17 PM

## 2017-11-25 NOTE — Anesthesia Procedure Notes (Signed)
Epidural Patient location during procedure: OB Start time: 11/25/2017 4:10 AM End time: 11/25/2017 4:25 AM  Staffing Anesthesiologist: Durenda Hurt, MD Performed: anesthesiologist   Preanesthetic Checklist Completed: patient identified, site marked, surgical consent, pre-op evaluation, timeout performed, IV checked, risks and benefits discussed and monitors and equipment checked  Epidural Patient position: sitting Prep: ChloraPrep Patient monitoring: heart rate, continuous pulse ox and blood pressure Approach: midline Location: L3-L4 Injection technique: LOR saline  Needle:  Needle type: Tuohy  Needle gauge: 18 G Needle length: 9 cm and 9 Needle insertion depth: 7 cm Catheter type: closed end flexible Catheter size: 20 Guage Catheter at skin depth: 11 cm Test dose: negative and Other  Assessment Sensory level: T6 Events: injection not painful, no injection resistance, negative IV test and no paresthesia  Additional Notes   Patient tolerated the insertion well without complications.Reason for block:procedure for pain

## 2017-11-26 LAB — CBC
HEMATOCRIT: 31 % — AB (ref 35.0–47.0)
Hemoglobin: 10.4 g/dL — ABNORMAL LOW (ref 12.0–16.0)
MCH: 28.1 pg (ref 26.0–34.0)
MCHC: 33.7 g/dL (ref 32.0–36.0)
MCV: 83.6 fL (ref 80.0–100.0)
Platelets: 206 10*3/uL (ref 150–440)
RBC: 3.71 MIL/uL — ABNORMAL LOW (ref 3.80–5.20)
RDW: 14.6 % — AB (ref 11.5–14.5)
WBC: 13.1 10*3/uL — AB (ref 3.6–11.0)

## 2017-11-26 LAB — COMPREHENSIVE METABOLIC PANEL
ALK PHOS: 107 U/L (ref 38–126)
ALT: 14 U/L (ref 0–44)
AST: 26 U/L (ref 15–41)
Albumin: 2.2 g/dL — ABNORMAL LOW (ref 3.5–5.0)
Anion gap: 9 (ref 5–15)
BILIRUBIN TOTAL: 0.5 mg/dL (ref 0.3–1.2)
BUN: 9 mg/dL (ref 6–20)
CALCIUM: 8.8 mg/dL — AB (ref 8.9–10.3)
CO2: 21 mmol/L — ABNORMAL LOW (ref 22–32)
CREATININE: 0.6 mg/dL (ref 0.44–1.00)
Chloride: 110 mmol/L (ref 98–111)
GFR calc Af Amer: >60 mL/min (ref >60)
Glucose, Bld: 84 mg/dL (ref 70–99)
POTASSIUM: 4.1 mmol/L (ref 3.5–5.1)
Sodium: 140 mmol/L (ref 135–145)
TOTAL PROTEIN: 5.6 g/dL — AB (ref 6.5–8.1)

## 2017-11-26 LAB — RPR: RPR: NONREACTIVE

## 2017-11-26 MED ORDER — NORETHINDRONE 0.35 MG PO TABS: 1.0000 | ORAL_TABLET | Freq: Every day | ORAL | 11 refills | Status: DC

## 2017-11-26 MED ORDER — IBUPROFEN 600 MG PO TABS
600.0000 mg | ORAL_TABLET | Freq: Four times a day (QID) | ORAL | Status: DC
  Administered 2017-11-26: 600 mg via ORAL
  Filled 2017-11-26: qty 1

## 2017-11-26 NOTE — Discharge Summary (Addendum)
Discharge Summary  Date of Admission: 11/24/2017  Date of Discharge: 11/26/2017  Admitting Diagnosis: Induction of labor at [redacted]w[redacted]d   Mode of Delivery: normal spontaneous vaginal delivery                 Discharge Diagnosis: Preeclampsia (mild) and polyhydramnios    Intrapartum Procedures: epidural   Post partum procedures: none  Complications: 2 degree perineal laceration                      Discharge Day SOAP Note:  Progress Note - Vaginal Delivery  Toni Mccormick is a 27 y.o. G2P1102 now PP day 1 s/p Vaginal, Spontaneous . Delivery was uncomplicated  Subjective  The patient has the following complaints: has no unusual complaints  Pain is controlled with current medications.   Patient is urinating without difficulty.  She is ambulating well.     Objective  Vital signs: BP 129/85 (BP Location: Left Arm)   Pulse 89   Temp 97.6 F (36.4 C) (Oral)   Resp 18   Ht 5\' 5"  (1.651 m)   Wt 99.4 kg   LMP 03/11/2017   SpO2 99%   Breastfeeding? Unknown   BMI 36.47 kg/m   Physical Exam: Gen: NAD Fundus Fundal Tone: Firm  Lochia Amount: Small  Perineum Appearance: Edematous     Data Review Labs: CBC Latest Ref Rng & Units 11/26/2017 11/25/2017 11/23/2017  WBC 3.6 - 11.0 K/uL 13.1(H) 9.5 8.2  Hemoglobin 12.0 - 16.0 g/dL 10.4(L) 11.7(L) 11.6(L)  Hematocrit 35.0 - 47.0 % 31.0(L) 34.7(L) 34.2(L)  Platelets 150 - 440 K/uL 206 264 209   A POS  Assessment/Plan  Active Problems:   Preeclampsia   Polyhydramnios   Encounter for induction of labor    Plan for discharge today.   Discharge Instructions: Per After Visit Summary. Activity: Advance as tolerated. Pelvic rest for 6 weeks.  Also refer to After Visit Summary Diet: Regular Medications: Allergies as of 11/26/2017   No Known Allergies     Medication List    TAKE these medications   norethindrone 0.35 MG tablet Commonly known as:  MICRONOR,CAMILA,ERRIN Take 1 tablet (0.35 mg  total) by mouth daily.   prenatal multivitamin Tabs tablet Take 1 tablet by mouth daily at 12 noon.   ranitidine 150 MG tablet Commonly known as:  ZANTAC Take 150 mg by mouth 2 (two) times daily.      Outpatient follow up:  Follow-up Information    Diona Fanti, CNM Follow up.   Specialties:  Certified Nurse Midwife, Obstetrics and Gynecology, Radiology Why:  Please schedule a blood pressure check with JML on Monday Contact information: [place] [place] [address] [phone]          Postpartum contraception: Progestin only pill, start @ 4 wks pp  Discharged Condition: good  Discharged to: home  Newborn Data: Disposition:home with mother  Apgars: APGAR (1 MIN): 8   APGAR (5 MINS): 9   APGAR (10 MINS):    Baby Feeding: Bottle and Breast    Philip Aspen, CNM  11/26/2017 12:01 PM

## 2017-11-26 NOTE — Discharge Instructions (Signed)
Vaginal Delivery, Care After °Refer to this sheet in the next few weeks. These instructions provide you with information about caring for yourself after vaginal delivery. Your health care provider may also give you more specific instructions. Your treatment has been planned according to current medical practices, but problems sometimes occur. Call your health care provider if you have any problems or questions. °What can I expect after the procedure? °After vaginal delivery, it is common to have: °· Some bleeding from your vagina. °· Soreness in your abdomen, your vagina, and the area of skin between your vaginal opening and your anus (perineum). °· Pelvic cramps. °· Fatigue. ° °Follow these instructions at home: °Medicines °· Take over-the-counter and prescription medicines only as told by your health care provider. °· If you were prescribed an antibiotic medicine, take it as told by your health care provider. Do not stop taking the antibiotic until it is finished. °Driving ° °· Do not drive or operate heavy machinery while taking prescription pain medicine. °· Do not drive for 24 hours if you received a sedative. °Lifestyle °· Do not drink alcohol. This is especially important if you are breastfeeding or taking medicine to relieve pain. °· Do not use tobacco products, including cigarettes, chewing tobacco, or e-cigarettes. If you need help quitting, ask your health care provider. °Eating and drinking °· Drink at least 8 eight-ounce glasses of water every day unless you are told not to by your health care provider. If you choose to breastfeed your baby, you may need to drink more water than this. °· Eat high-fiber foods every day. These foods may help prevent or relieve constipation. High-fiber foods include: °? Whole grain cereals and breads. °? Brown rice. °? Beans. °? Fresh fruits and vegetables. °Activity °· Return to your normal activities as told by your health care provider. Ask your health care provider  what activities are safe for you. °· Rest as much as possible. Try to rest or take a nap when your baby is sleeping. °· Do not lift anything that is heavier than your baby or 10 lb (4.5 kg) until your health care provider says that it is safe. °· Talk with your health care provider about when you can engage in sexual activity. This may depend on your: °? Risk of infection. °? Rate of healing. °? Comfort and desire to engage in sexual activity. °Vaginal Care °· If you have an episiotomy or a vaginal tear, check the area every day for signs of infection. Check for: °? More redness, swelling, or pain. °? More fluid or blood. °? Warmth. °? Pus or a bad smell. °· Do not use tampons or douches until your health care provider says this is safe. °· Watch for any blood clots that may pass from your vagina. These may look like clumps of dark red, brown, or black discharge. °General instructions °· Keep your perineum clean and dry as told by your health care provider. °· Wear loose, comfortable clothing. °· Wipe from front to back when you use the toilet. °· Ask your health care provider if you can shower or take a bath. If you had an episiotomy or a perineal tear during labor and delivery, your health care provider may tell you not to take baths for a certain length of time. °· Wear a bra that supports your breasts and fits you well. °· If possible, have someone help you with household activities and help care for your baby for at least a few days after   you leave the hospital. °· Keep all follow-up visits for you and your baby as told by your health care provider. This is important. °Contact a health care provider if: °· You have: °? Vaginal discharge that has a bad smell. °? Difficulty urinating. °? Pain when urinating. °? A sudden increase or decrease in the frequency of your bowel movements. °? More redness, swelling, or pain around your episiotomy or vaginal tear. °? More fluid or blood coming from your episiotomy or  vaginal tear. °? Pus or a bad smell coming from your episiotomy or vaginal tear. °? A fever. °? A rash. °? Little or no interest in activities you used to enjoy. °? Questions about caring for yourself or your baby. °· Your episiotomy or vaginal tear feels warm to the touch. °· Your episiotomy or vaginal tear is separating or does not appear to be healing. °· Your breasts are painful, hard, or turn red. °· You feel unusually sad or worried. °· You feel nauseous or you vomit. °· You pass large blood clots from your vagina. If you pass a blood clot from your vagina, save it to show to your health care provider. Do not flush blood clots down the toilet without having your health care provider look at them. °· You urinate more than usual. °· You are dizzy or light-headed. °· You have not breastfed at all and you have not had a menstrual period for 12 weeks after delivery. °· You have stopped breastfeeding and you have not had a menstrual period for 12 weeks after you stopped breastfeeding. °Get help right away if: °· You have: °? Pain that does not go away or does not get better with medicine. °? Chest pain. °? Difficulty breathing. °? Blurred vision or spots in your vision. °? Thoughts about hurting yourself or your baby. °· You develop pain in your abdomen or in one of your legs. °· You develop a severe headache. °· You faint. °· You bleed from your vagina so much that you fill two sanitary pads in one hour. °This information is not intended to replace advice given to you by your health care provider. Make sure you discuss any questions you have with your health care provider. °Document Released: 03/15/2000 Document Revised: 08/30/2015 Document Reviewed: 04/02/2015 °Elsevier Interactive Patient Education © 2018 [employer] ° °

## 2017-11-26 NOTE — Progress Notes (Signed)
MD order for pt discharge home.  Mother given all discharge instructions and understands all.  Rx's called to pharmacy by MD.  Pt to call and make her own f/u appt bc office was closed for lunch during discahrge.  Discharged home via wheelchair with baby by auxillary.

## 2017-11-26 NOTE — Progress Notes (Signed)
Both parents viewed the DVD, "The Period of Purple Cry" prior to discharge home.

## 2017-11-26 NOTE — Plan of Care (Signed)
VS stable; up ad lib; taking motrin and tylenol for pain control; nipple soreness and has comfort gels and coconut oil; LC to see pt again today due to baby "pinching" still a lot when he latches

## 2017-11-26 NOTE — Anesthesia Postprocedure Evaluation (Signed)
Anesthesia Post Note  Patient: Toni Mccormick  Procedure(s) Performed: AN AD Wink  Patient location during evaluation: Mother Baby Anesthesia Type: Epidural Level of consciousness: awake and alert Pain management: pain level controlled Vital Signs Assessment: post-procedure vital signs reviewed and stable Respiratory status: spontaneous breathing, nonlabored ventilation and respiratory function stable Cardiovascular status: stable Postop Assessment: no headache, no backache and epidural receding Anesthetic complications: no     Last Vitals:  Vitals:   11/26/17 0434 11/26/17 0828  BP: (!) 138/91 129/85  Pulse: 83 89  Resp: 16 18  Temp: 36.5 C 36.4 C  SpO2: 98% 99%    Last Pain:  Vitals:   11/26/17 0853  TempSrc:   PainSc: 0-No pain                 Alison Stalling

## 2017-11-26 NOTE — Final Progress Note (Signed)
Discharge Day SOAP Note:  Progress Note - Vaginal Delivery  Toni Mccormick is a 27 y.o. G2P1102 now PP day 1 s/p Vaginal, Spontaneous . Delivery was uncomplicated  Subjective  The patient has the following complaints: has no unusual complaints  Pain is controlled with current medications.   Patient is urinating without difficulty.  She is ambulating well.     Objective  Vital signs: BP 129/85 (BP Location: Left Arm)   Pulse 89   Temp 97.6 F (36.4 C) (Oral)   Resp 18   Ht 5\' 5"  (1.651 m)   Wt 99.4 kg   LMP 03/11/2017   SpO2 99%   Breastfeeding? Unknown   BMI 36.47 kg/m   Physical Exam: Gen: NAD Fundus Fundal Tone: Firm  Lochia Amount: Small  Perineum Appearance: Edematous     Data Review Labs: CBC Latest Ref Rng & Units 11/26/2017 11/25/2017 11/23/2017  WBC 3.6 - 11.0 K/uL 13.1(H) 9.5 8.2  Hemoglobin 12.0 - 16.0 g/dL 10.4(L) 11.7(L) 11.6(L)  Hematocrit 35.0 - 47.0 % 31.0(L) 34.7(L) 34.2(L)  Platelets 150 - 440 K/uL 206 264 209   A POS  Assessment/Plan  Active Problems:   Preeclampsia   Polyhydramnios   Encounter for induction of labor    Plan for discharge today.   Discharge Instructions: Per After Visit Summary. Activity: Advance as tolerated. Pelvic rest for 6 weeks.  Also refer to After Visit Summary Diet: Regular Medications: Allergies as of 11/26/2017   No Known Allergies     Medication List    TAKE these medications   norethindrone 0.35 MG tablet Commonly known as:  MICRONOR,CAMILA,ERRIN Take 1 tablet (0.35 mg total) by mouth daily.   prenatal multivitamin Tabs tablet Take 1 tablet by mouth daily at 12 noon.   ranitidine 150 MG tablet Commonly known as:  ZANTAC Take 150 mg by mouth 2 (two) times daily.      Outpatient follow up:  Follow-up Information    Diona Fanti, CNM Follow up.   Specialties:  Certified Nurse Midwife, Obstetrics and Gynecology, Radiology Why:  Please schedule a blood pressure check with JML  on Monday Contact information: [place] [address] [phone]          Postpartum contraception: Progestin only pill, start @ 4 wks pp  Discharged Condition: good  Discharged to: home  Newborn Data: Disposition:home with mother  Apgars: APGAR (1 MIN): 8   APGAR (5 MINS): 9   APGAR (10 MINS):    Baby Feeding: Bottle and Breast    Philip Aspen, CNM  11/26/2017 12:01 PM

## 2017-11-26 NOTE — Lactation Note (Signed)
This note was copied from a baby's chart. Lactation Consultation Note  Patient Name: Boy Anayansi Rundquist XAJOI'N Date: 11/26/2017 Reason for consult: Follow-up assessment   Arleen continues to struggle being able to nurse [place] even with some help. She is very passive in holding and trying to feed him. I explained how he needs help being brought to breast and then having pillow support so she won't fatigue keeping him in position. She prefers having him in front of her instead of next to her in football hold. I taught her cross cradle , but she was awkward with it. She still said she liked it better than FB hold, so we continued. After some frustrating attempts to have Hudson latch well and only get nipple, I gave her pros/cons of nipple shield and applied it once she agreed. She said she did feel better with the shield, but it was still frustrating to use as Hudson kept knocking it off. He finally developed a strong, rhythmic suck swallow pattern once I got him started by instilling some formula into shield with curved syringe. (They had already given bottles of formula over night. She planned to give her milk and formula from the beginning as she will go back to work soon and says she cannot pump at work.). He nursed well for about 7 minutes and then fell asleep. He took in approx 7 ml of formula.   I reviewed supply/demand with parents and discussed realistic feeding. It does not seem realistic for her to exclusively breastfeed, nor is it her wishes, so we discussed pump and hand expression options if she wants help with her milk supply. Meanwhile, she plans to breastfeed as well and as much as she can and supplement with formula in slow flow bottle. We discussed breast care based on wanting or not wanting a milk supply.   She has [place] contact info.     Maternal Data    Feeding Feeding Type: Breast Milk with Formula added Length of feed: 10 min  LATCH Score Latch: Repeated attempts needed to  sustain latch, nipple held in mouth throughout feeding, stimulation needed to elicit sucking reflex.  Audible Swallowing: A few with stimulation  Type of Nipple: Everted at rest and after stimulation  Comfort (Breast/Nipple): Filling, red/small blisters or bruises, mild/mod discomfort(face of nipples red, sore)  Hold (Positioning): Assistance needed to correctly position infant at breast and maintain latch.(mom very passive; not really helping)  LATCH Score: 6  Interventions Interventions: Breast feeding basics reviewed;Assisted with latch;Skin to skin;Adjust position;Support pillows;Position options;Expressed milk;Comfort gels  Lactation Tools Discussed/Used Tools: Nipple Ariadne Rissmiller Nipple shield size: 24   Consult Status Consult Status: PRN    Roque Cash 11/26/2017, 11:23 AM

## 2017-11-28 ENCOUNTER — Encounter: Payer: Self-pay | Admitting: Certified Nurse Midwife

## 2017-12-03 ENCOUNTER — Encounter: Payer: Self-pay | Admitting: Certified Nurse Midwife

## 2017-12-03 ENCOUNTER — Ambulatory Visit (INDEPENDENT_AMBULATORY_CARE_PROVIDER_SITE_OTHER): Payer: BLUE CROSS/BLUE SHIELD | Admitting: Certified Nurse Midwife

## 2017-12-03 VITALS — BP 123/95

## 2017-12-03 DIAGNOSIS — Z0130 Encounter for examination of blood pressure without abnormal findings: Secondary | ICD-10-CM

## 2017-12-03 NOTE — Progress Notes (Signed)
Pt stopped by the office for a BP check.

## 2017-12-05 ENCOUNTER — Encounter: Payer: Self-pay | Admitting: Certified Nurse Midwife

## 2017-12-05 NOTE — Telephone Encounter (Signed)
Please contact patient. Any pain with urination? Has she tried heating pad? Is she still taking Motrin? Thanks, JML

## 2018-01-08 ENCOUNTER — Ambulatory Visit (INDEPENDENT_AMBULATORY_CARE_PROVIDER_SITE_OTHER): Payer: BLUE CROSS/BLUE SHIELD | Admitting: Certified Nurse Midwife

## 2018-01-08 ENCOUNTER — Encounter: Payer: Self-pay | Admitting: Certified Nurse Midwife

## 2018-01-08 NOTE — Patient Instructions (Signed)
Preventive Care 18-39 Years, Female Preventive care refers to lifestyle choices and visits with your health care provider that can promote health and wellness. What does preventive care include?  A yearly physical exam. This is also called an annual well check.  Dental exams once or twice a year.  Routine eye exams. Ask your health care provider how often you should have your eyes checked.  Personal lifestyle choices, including: ? Daily care of your teeth and gums. ? Regular physical activity. ? Eating a healthy diet. ? Avoiding tobacco and drug use. ? Limiting alcohol use. ? Practicing safe sex. ? Taking vitamin and mineral supplements as recommended by your health care provider. What happens during an annual well check? The services and screenings done by your health care provider during your annual well check will depend on your age, overall health, lifestyle risk factors, and family history of disease. Counseling Your health care provider may ask you questions about your:  Alcohol use.  Tobacco use.  Drug use.  Emotional well-being.  Home and relationship well-being.  Sexual activity.  Eating habits.  Work and work Statistician.  Method of birth control.  Menstrual cycle.  Pregnancy history.  Screening You may have the following tests or measurements:  Height, weight, and BMI.  Diabetes screening. This is done by checking your blood sugar (glucose) after you have not eaten for a while (fasting).  Blood pressure.  Lipid and cholesterol levels. These may be checked every 5 years starting at age 66.  Skin check.  Hepatitis C blood test.  Hepatitis B blood test.  Sexually transmitted disease (STD) testing.  BRCA-related cancer screening. This may be done if you have a family history of breast, ovarian, tubal, or peritoneal cancers.  Pelvic exam and Pap test. This may be done every 3 years starting at age 40. Starting at age 59, this may be done every 5  years if you have a Pap test in combination with an HPV test.  Discuss your test results, treatment options, and if necessary, the need for more tests with your health care provider. Vaccines Your health care provider may recommend certain vaccines, such as:  Influenza vaccine. This is recommended every year.  Tetanus, diphtheria, and acellular pertussis (Tdap, Td) vaccine. You may need a Td booster every 10 years.  Varicella vaccine. You may need this if you have not been vaccinated.  HPV vaccine. If you are 69 or younger, you may need three doses over 6 months.  Measles, mumps, and rubella (MMR) vaccine. You may need at least one dose of MMR. You may also need a second dose.  Pneumococcal 13-valent conjugate (PCV13) vaccine. You may need this if you have certain conditions and were not previously vaccinated.  Pneumococcal polysaccharide (PPSV23) vaccine. You may need one or two doses if you smoke cigarettes or if you have certain conditions.  Meningococcal vaccine. One dose is recommended if you are age 27-21 years and a first-year college student living in a residence hall, or if you have one of several medical conditions. You may also need additional booster doses.  Hepatitis A vaccine. You may need this if you have certain conditions or if you travel or work in places where you may be exposed to hepatitis A.  Hepatitis B vaccine. You may need this if you have certain conditions or if you travel or work in places where you may be exposed to hepatitis B.  Haemophilus influenzae type b (Hib) vaccine. You may need this if  you have certain risk factors.  Talk to your health care provider about which screenings and vaccines you need and how often you need them. This information is not intended to replace advice given to you by your health care provider. Make sure you discuss any questions you have with your health care provider. Document Released: 05/14/2001 Document Revised: 12/06/2015  Document Reviewed: 01/17/2015 Elsevier Interactive Patient Education  2018 Elsevier Inc. Postpartum Depression and Baby Blues The postpartum period begins right after the birth of a baby. During this time, there is often a great amount of joy and excitement. It is also a time of many changes in the life of the parents. Regardless of how many times a mother gives birth, each child brings new challenges and dynamics to the family. It is not unusual to have feelings of excitement along with confusing shifts in moods, emotions, and thoughts. All mothers are at risk of developing postpartum depression or the "baby blues." These mood changes can occur right after giving birth, or they may occur many months after giving birth. The baby blues or postpartum depression can be mild or severe. Additionally, postpartum depression can go away rather quickly, or it can be a long-term condition. What are the causes? Raised hormone levels and the rapid drop in those levels are thought to be a main cause of postpartum depression and the baby blues. A number of hormones change during and after pregnancy. Estrogen and progesterone usually decrease right after the delivery of your baby. The levels of thyroid hormone and various cortisol steroids also rapidly drop. Other factors that play a role in these mood changes include major life events and genetics. What increases the risk? If you have any of the following risks for the baby blues or postpartum depression, know what symptoms to watch out for during the postpartum period. Risk factors that may increase the likelihood of getting the baby blues or postpartum depression include:  Having a personal or family history of depression.  Having depression while being pregnant.  Having premenstrual mood issues or mood issues related to oral contraceptives.  Having a lot of life stress.  Having marital conflict.  Lacking a social support network.  Having a baby with  special needs.  Having health problems, such as diabetes.  What are the signs or symptoms? Symptoms of baby blues include:  Brief changes in mood, such as going from extreme happiness to sadness.  Decreased concentration.  Difficulty sleeping.  Crying spells, tearfulness.  Irritability.  Anxiety.  Symptoms of postpartum depression typically begin within the first month after giving birth. These symptoms include:  Difficulty sleeping or excessive sleepiness.  Marked weight loss.  Agitation.  Feelings of worthlessness.  Lack of interest in activity or food.  Postpartum psychosis is a very serious condition and can be dangerous. Fortunately, it is rare. Displaying any of the following symptoms is cause for immediate medical attention. Symptoms of postpartum psychosis include:  Hallucinations and delusions.  Bizarre or disorganized behavior.  Confusion or disorientation.  How is this diagnosed? A diagnosis is made by an evaluation of your symptoms. There are no medical or lab tests that lead to a diagnosis, but there are various questionnaires that a health care provider may use to identify those with the baby blues, postpartum depression, or psychosis. Often, a screening tool called the Edinburgh Postnatal Depression Scale is used to diagnose depression in the postpartum period. How is this treated? The baby blues usually goes away on its own in   1-2 weeks. Social support is often all that is needed. You will be encouraged to get adequate sleep and rest. Occasionally, you may be given medicines to help you sleep. Postpartum depression requires treatment because it can last several months or longer if it is not treated. Treatment may include individual or group therapy, medicine, or both to address any social, physiological, and psychological factors that may play a role in the depression. Regular exercise, a healthy diet, rest, and social support may also be strongly  recommended. Postpartum psychosis is more serious and needs treatment right away. Hospitalization is often needed. Follow these instructions at home:  Get as much rest as you can. Nap when the baby sleeps.  Exercise regularly. Some women find yoga and walking to be beneficial.  Eat a balanced and nourishing diet.  Do little things that you enjoy. Have a cup of tea, take a bubble bath, read your favorite magazine, or listen to your favorite music.  Avoid alcohol.  Ask for help with household chores, cooking, grocery shopping, or running errands as needed. Do not try to do everything.  Talk to people close to you about how you are feeling. Get support from your partner, family members, friends, or other new moms.  Try to stay positive in how you think. Think about the things you are grateful for.  Do not spend a lot of time alone.  Only take over-the-counter or prescription medicine as directed by your health care provider.  Keep all your postpartum appointments.  Let your health care provider know if you have any concerns. Contact a health care provider if: You are having a reaction to or problems with your medicine. Get help right away if:  You have suicidal feelings.  You think you may harm the baby or someone else. This information is not intended to replace advice given to you by your health care provider. Make sure you discuss any questions you have with your health care provider. Document Released: 12/21/2003 Document Revised: 08/24/2015 Document Reviewed: 12/28/2012 Elsevier Interactive Patient Education  2017 Elsevier Inc.  

## 2018-01-08 NOTE — Progress Notes (Signed)
Subjective:    Toni Mccormick is a 27 y.o. G37P1102 Caucasian female who presents for a postpartum visit. She is 6 weeks postpartum following a spontaneous vaginal delivery at 37+0 gestational weeks. Anesthesia: epidural. I have fully reviewed the prenatal and intrapartum course.   Postpartum course has been uncomplicated. Baby's course has been uncomplicated. Baby is feeding by breast with formula supplementation. Bleeding: normal menses has resumed. Bowel function is normal. Bladder function is normal.   Patient is not sexually active. Contraception method is oral progesterone-only contraceptive, but reports difficulty remembering to take each day at the same time.   Postpartum depression screening: negative. Score 7.  History significant for postpartum depression. Last pap 06/13/2015 and was Normal.  Denies difficulty breathing or respiratory distress, chest pain, abdominal pain, excessive vaginal bleeding, dysuria, and leg pain or swelling.   The following portions of the patient's history were reviewed and updated as appropriate: allergies, current medications, past medical history, past surgical history and problem list.  Review of Systems  Pertinent items are noted in HPI.   Objective:   ,BP 122/87   Pulse 61   Ht 5\' 5"  (1.651 m)   Wt 197 lb 3.2 oz (89.4 kg)   LMP 12/25/2017 (Approximate)   Breastfeeding? Yes   BMI 32.82 kg/m   General:  alert, cooperative and no distress   Breasts:  deferred, no complaints  Lungs: clear to auscultation bilaterally  Heart:  regular rate and rhythm  Abdomen: soft, nontender   Vulva: normal  Vagina: normal vagina  Cervix:  closed  Corpus: Well-involuted  Adnexa:  Non-palpable      Office Visit from 01/08/2018 in [facility]  PHQ-9 Total Score  7     Assessment:   Postpartum exam Six (6) wks s/p spontaneous vaginal birth Breastfeeding with formula supplementation Depression screening Contraception counseling    Plan:   May return to work without restriction  Postpartum labs: see orders  Discussed contraception options. Patient concerning Mirena placement; see AVS  Reviewed red flag symptoms and when to call  Follow up in: 6 weeks for mood check or earlier if needed   Diona Fanti, CNM [facility], [facility]

## 2018-01-13 LAB — CBC
HEMATOCRIT: 40.1 % (ref 34.0–46.6)
Hemoglobin: 12.9 g/dL (ref 11.1–15.9)
MCH: 26.9 pg (ref 26.6–33.0)
MCHC: 32.2 g/dL (ref 31.5–35.7)
MCV: 84 fL (ref 79–97)
PLATELETS: 392 10*3/uL (ref 150–450)
RBC: 4.8 x10E6/uL (ref 3.77–5.28)
RDW: 15.7 % — ABNORMAL HIGH (ref 12.3–15.4)
WBC: 7.5 10*3/uL (ref 3.4–10.8)

## 2018-01-13 LAB — VITAMIN D 1,25 DIHYDROXY
VITAMIN D 1, 25 (OH) TOTAL: 35 pg/mL
Vitamin D3 1, 25 (OH)2: 35 pg/mL

## 2018-01-13 LAB — FERRITIN: FERRITIN: 13 ng/mL — AB (ref 15–150)

## 2018-01-14 ENCOUNTER — Encounter: Payer: Self-pay | Admitting: Certified Nurse Midwife

## 2018-04-07 ENCOUNTER — Other Ambulatory Visit: Payer: Self-pay | Admitting: *Deleted

## 2018-04-07 MED ORDER — NORGESTIMATE-ETH ESTRADIOL 0.25-35 MG-MCG PO TABS: 1.0000 | ORAL_TABLET | Freq: Every day | ORAL | 4 refills | Status: DC

## 2018-05-28 ENCOUNTER — Other Ambulatory Visit: Payer: Self-pay | Admitting: *Deleted

## 2018-05-28 MED ORDER — PANTOPRAZOLE SODIUM 40 MG PO TBEC: 40.0000 mg | DELAYED_RELEASE_TABLET | Freq: Every day | ORAL | 3 refills | Status: DC

## 2018-09-16 ENCOUNTER — Telehealth: Payer: Self-pay

## 2018-09-16 NOTE — Telephone Encounter (Signed)
Coronavirus (COVID-19) Are you at risk?  Are you at risk for the Coronavirus (COVID-19)?  To be considered HIGH RISK for Coronavirus (COVID-19), you have to meet the following criteria:  . Traveled to [place], [place], [place], [place] or [place]; or in the [place] to [place], [place], [place], or [place]; and have fever, cough, and shortness of breath within the last 2 weeks of travel OR . Been in close contact with a person diagnosed with COVID-19 within the last 2 weeks and have fever, cough, and shortness of breath . IF YOU DO NOT MEET THESE CRITERIA, YOU ARE CONSIDERED LOW RISK FOR COVID-19.  What to do if you are HIGH RISK for COVID-19?  Marland Kitchen If you are having a medical emergency, call 911. . Seek medical care right away. Before you go to a doctor's office, urgent care or emergency department, call ahead and tell them about your recent travel, contact with someone diagnosed with COVID-19, and your symptoms. You should receive instructions from your physician's office regarding next steps of care.  . When you arrive at healthcare provider, tell the healthcare staff immediately you have returned from visiting [place], [place], [place], [place] or [place]; or traveled in the [place] to [place], [place], [place], or [place]; in the last two weeks or you have been in close contact with a person diagnosed with COVID-19 in the last 2 weeks.   . Tell the health care staff about your symptoms: fever, cough and shortness of breath. . After you have been seen by a medical provider, you will be either: o Tested for (COVID-19) and discharged home on quarantine except to seek medical care if symptoms worsen, and asked to  - Stay home and avoid contact with others until you get your results (4-5 days)  - Avoid travel on public transportation if possible (such as bus, train, or airplane) or o Sent to the Emergency Department by EMS for evaluation, COVID-19 testing, and possible  admission depending on your condition and test results.  What to do if you are LOW RISK for COVID-19?  Reduce your risk of any infection by using the same precautions used for avoiding the common cold or flu:  Marland Kitchen Wash your hands often with soap and warm water for at least 20 seconds.  If soap and water are not readily available, use an alcohol-based hand sanitizer with at least 60% alcohol.  . If coughing or sneezing, cover your mouth and nose by coughing or sneezing into the elbow areas of your shirt or coat, into a tissue or into your sleeve (not your hands). . Avoid shaking hands with others and consider head nods or verbal greetings only. . Avoid touching your eyes, nose, or mouth with unwashed hands.  . Avoid close contact with people who are Toni Mccormick. . Avoid places or events with large numbers of people in one location, like concerts or sporting events. . Carefully consider travel plans you have or are making. . If you are planning any travel outside or inside the [place], visit the CDC's Travelers' Health webpage for the latest health notices. . If you have some symptoms but not all symptoms, continue to monitor at home and seek medical attention if your symptoms worsen. . If you are having a medical emergency, call 911.  09/16/18 SCREENING NEG SLS ADDITIONAL HEALTHCARE OPTIONS FOR PATIENTS  [place] / e-Visit: [email or web address]         [facility]: [phone]  [facility]: [phone]                   [facility]: [phone]  

## 2018-09-17 ENCOUNTER — Encounter: Payer: Self-pay | Admitting: Certified Nurse Midwife

## 2018-09-17 ENCOUNTER — Other Ambulatory Visit: Payer: Self-pay

## 2018-09-17 ENCOUNTER — Other Ambulatory Visit (HOSPITAL_COMMUNITY)
Admission: RE | Admit: 2018-09-17 | Discharge: 2018-09-17 | Disposition: A | Discharge status: Home / Self Care | Payer: BC Managed Care – PPO | Source: Ambulatory Visit | Attending: Certified Nurse Midwife | Admitting: Certified Nurse Midwife

## 2018-09-17 ENCOUNTER — Ambulatory Visit (INDEPENDENT_AMBULATORY_CARE_PROVIDER_SITE_OTHER): Payer: BC Managed Care – PPO | Admitting: Certified Nurse Midwife

## 2018-09-17 VITALS — BP 112/80 | HR 71 | Ht 65.0 in | Wt 190.1 lb

## 2018-09-17 DIAGNOSIS — F32A Depression, unspecified: Secondary | ICD-10-CM

## 2018-09-17 DIAGNOSIS — Z01419 Encounter for gynecological examination (general) (routine) without abnormal findings: Secondary | ICD-10-CM | POA: Diagnosis present

## 2018-09-17 DIAGNOSIS — Z124 Encounter for screening for malignant neoplasm of cervix: Secondary | ICD-10-CM | POA: Diagnosis present

## 2018-09-17 DIAGNOSIS — N644 Mastodynia: Secondary | ICD-10-CM

## 2018-09-17 DIAGNOSIS — F419 Anxiety disorder, unspecified: Secondary | ICD-10-CM

## 2018-09-17 DIAGNOSIS — Z3041 Encounter for surveillance of contraceptive pills: Secondary | ICD-10-CM | POA: Diagnosis not present

## 2018-09-17 DIAGNOSIS — F329 Major depressive disorder, single episode, unspecified: Secondary | ICD-10-CM

## 2018-09-17 MED ORDER — NORGESTIMATE-ETH ESTRADIOL 0.25-35 MG-MCG PO TABS: 1.0000 | ORAL_TABLET | Freq: Every day | ORAL | 4 refills | Status: DC

## 2018-09-17 NOTE — Progress Notes (Signed)
Patient here for annual exam, c/o intermittent left breast pain x3 months. Patient also c/o anxiety "for a long time", started Effexor about 2 weeks ago with some relief.

## 2018-09-17 NOTE — Progress Notes (Signed)
ANNUAL PREVENTATIVE CARE GYN  ENCOUNTER NOTE  Subjective:       Toni Mccormick is a 28 y.o. [phone] female here for a routine annual gynecologic exam.  Current complaints: 1. Needs birth control refill 2. Time for Pap smear 3. Anxiety 4. Breast pain  Seen by Dr. Sabra Heck two (2) weeks ago and started Effexor PO for anxiety. Working for home. Reports intermittent left breast pain approximately two (2) to three (3) times a months sometimes around cycle.   Denies difficulty breathing or respiratory distress, chest pain, abdominal pain, excessive vaginal bleeding, dysuria, and leg pain or swelling.    Gynecologic History  Patient's last menstrual period was 09/01/2018 (approximate).  Contraception: OCP (estrogen/progesterone), Ortho-cyclen  Last Pap: 05/2015. Results were: normal  Obstetric History  OB History  Gravida Para Term Preterm AB Living  2 2 1 1   2   SAB TAB Ectopic Multiple Live Births        0 2    # Outcome Date GA Lbr Len/2nd Weight Sex Delivery Anes PTL Lv  2 Term 11/25/17 [redacted]w[redacted]d 04:42 / 00:09 7 lb 3 oz (3.26 kg) M Vag-Spont EPI Y LIV  1 Preterm 04/29/14   5 lb 5 oz (2.41 kg) M Vag-Spont EPI Y LIV     Complications: Premature delivery, Ruptured, membranes, premature    Past Medical History:  Diagnosis Date  . B12 deficiency   . Chicken pox   . Gestational hypertension   . IBS (irritable bowel syndrome)   . Post partum depression     Past Surgical History:  Procedure Laterality Date  . TONSILLECTOMY AND ADENOIDECTOMY Bilateral 1992  . WISDOM TOOTH EXTRACTION      Current Outpatient Medications on File Prior to Visit  Medication Sig Dispense Refill  . cyanocobalamin (,VITAMIN B-12,) 1000 MCG/ML injection Inject 1,000 mcg into the muscle every 14 (fourteen) days.    . pantoprazole (PROTONIX) 40 MG tablet Take 1 tablet (40 mg total) by mouth daily. 90 tablet 3  . venlafaxine XR (EFFEXOR-XR) 37.5 MG 24 hr capsule Take 37.5 mg by mouth daily.     No  current facility-administered medications on file prior to visit.     No Known Allergies  Social History   Socioeconomic History  . Marital status: Married    Spouse name: Not on file  . Number of children: Not on file  . Years of education: Not on file  . Highest education level: Not on file  Occupational History  . Not on file  Social Needs  . Financial resource strain: Not on file  . Food insecurity    Worry: Not on file    Inability: Not on file  . Transportation needs    Medical: Not on file    Non-medical: Not on file  Tobacco Use  . Smoking status: Never Smoker  . Smokeless tobacco: Never Used  Substance and Sexual Activity  . Alcohol use: Not Currently    Frequency: Never    Comment: Rare  . Drug use: No  . Sexual activity: Yes    Birth control/protection: Pill  Lifestyle  . Physical activity    Days per week: Not on file    Minutes per session: Not on file  . Stress: Not on file  Relationships  . Social Herbalist on phone: Not on file    Gets together: Not on file    Attends religious service: Not on file    Active member of  club or organization: Not on file    Attends meetings of clubs or organizations: Not on file    Relationship status: Not on file  . Intimate partner violence    Fear of current or ex partner: Not on file    Emotionally abused: Not on file    Physically abused: Not on file    Forced sexual activity: Not on file  Other Topics Concern  . Not on file  Social History Narrative  . Not on file    Family History  Problem Relation Age of Onset  . Hyperlipidemia Father   . Hypertension Father   . Hyperlipidemia Maternal Grandmother   . Cancer Maternal Grandfather        lung  . Diabetes Paternal Grandmother   . Arthritis Paternal Grandfather   . Cancer Paternal Grandfather        lung  . Breast cancer Neg Hx   . Ovarian cancer Neg Hx   . Colon cancer Neg Hx     The following portions of the patient's history were  reviewed and updated as appropriate: allergies, current medications, past family history, past medical history, past social history, past surgical history and problem list.  Review of Systems  ROS negative except as noted above. Information obtained from patient.    Objective:   BP 112/80   Pulse 71   Ht 5\' 5"  (1.651 m)   Wt 190 lb 1.6 oz (86.2 kg)   LMP 09/01/2018 (Approximate)   Breastfeeding No   BMI 31.63 kg/m    CONSTITUTIONAL: Well-developed, well-nourished female in no acute distress.   PSYCHIATRIC: Normal mood and affect. Normal behavior. Normal judgment and thought content.  [place]: Alert and oriented to person, place, and time. Normal muscle tone coordination. No cranial nerve deficit noted.  HENT:  Normocephalic, atraumatic, External right and left ear normal.   EYES: Conjunctivae and EOM are normal. Pupils are equal and round.   NECK: Normal range of motion, supple, no masses.  Normal thyroid.   SKIN: Skin is warm and dry. No rash noted. Not diaphoretic. No erythema. No pallor.  CARDIOVASCULAR: Normal heart rate noted, regular rhythm, no murmur.  RESPIRATORY: Clear to auscultation bilaterally. Effort and breath sounds normal, no problems with respiration noted.  BREASTS: Symmetric in size. No masses, skin changes, nipple drainage, or lymphadenopathy.  ABDOMEN: Soft, normal bowel sounds, no distention noted.  No tenderness, rebound or guarding. Obese.   PELVIC:  External Genitalia: Normal  BUS: Normal  Vagina: Normal  Cervix: Normal, Pap collected  Uterus: Normal  Adnexa: Normal   MUSCULOSKELETAL: Normal range of motion. No tenderness.  No cyanosis, clubbing, or edema.  2+ distal pulses.  LYMPHATIC: No Axillary, Supraclavicular, or Inguinal Adenopathy.  Depression screen [facility] 2/9 09/17/2018 01/08/2018  Decreased Interest 1 0  Down, Depressed, Hopeless 1 1  PHQ - 2 Score 2 1  Altered sleeping 1 0  Tired, decreased energy 3 1  Change in appetite 3 3   Feeling bad or failure about yourself  3 1  Trouble concentrating 0 0  Moving slowly or fidgety/restless 1 1  Suicidal thoughts 0 0  PHQ-9 Score 13 7  Difficult doing work/chores Somewhat difficult -   GAD 7 : Generalized Anxiety Score 09/17/2018  Nervous, Anxious, on Edge 3  Control/stop worrying 3  Worry too much - different things 3  Trouble relaxing 1  Restless 0  Easily annoyed or irritable 1  Afraid - awful might happen 1  Total GAD  7 Score 12  Anxiety Difficulty Very difficult      Assessment:   Annual gynecologic examination 28 y.o.   Contraception: OCP (estrogen/progesterone), Ortho-cyclen  Obesity 1   Problem List Items Addressed This Visit      Other   Anxiety and depression   Relevant Medications   venlafaxine XR (EFFEXOR-XR) 37.5 MG 24 hr capsule    Other Visit Diagnoses    Well woman exam    -  Primary   Relevant Orders   Cytology - PAP   Breast pain       Surveillance for birth control, oral contraceptives       Screening for cervical cancer       Relevant Orders   Cytology - PAP      Plan:   Pap: Pap Co Test  Labs: Managed by PCP  Anxiety managed by PCP, follow up appointment scheduled.   Routine preventative health maintenance measures emphasized: Exercise/Diet/Weight control, Tobacco Warnings, Alcohol/Substance use risks and Stress Management; see AVS  Discussed home treatment measures (decreased salt and caffeine, increased water intake, and vitamin E or evening primrose oil supplement) for breast pain, will schedule ultrasound in three (3) months if symptoms fail to improve  Rx: Ortho-cyclen, see orders  Reviewed red flag symptoms and when to call  RTC x 1 year for ANNUAL EXAM or sooner if needed   Diona Fanti, CNM [facility], [facility] 09/17/18 1:05 PM

## 2018-09-17 NOTE — Patient Instructions (Addendum)
Ethinyl Estradiol; Norgestimate tablets What is this medicine? ETHINYL ESTRADIOL; NORGESTIMATE (ETH in il es tra DYE ole; nor JES ti mate) is an oral contraceptive. The products combine two types of female hormones, an estrogen and a progestin. They are used to prevent ovulation and pregnancy. Some products are also used to treat acne in females. This medicine may be used for other purposes; ask your health care provider or pharmacist if you have questions. COMMON BRAND NAME(S): Estarylla, Mili, MONO-LINYAH, MonoNessa, Norgestimate/Ethinyl Estradiol, Ortho Tri-Cyclen, Ortho Tri-Cyclen Lo, Ortho-Cyclen, Previfem, Sprintec, Tri-Estarylla, TRI-LINYAH, Tri-Lo-Estarylla, Tri-Lo-Marzia, Tri-Lo-Mili, Tri-Lo-Sprintec, Tri-Mili, Tri-Previfem, Tri-Sprintec, Tri-VyLibra, Trinessa, Trinessa Lo, VyLibra What should I tell my health care provider before I take this medicine? They need to know if you have or ever had any of these conditions: -abnormal vaginal bleeding -blood vessel disease or blood clots -breast, cervical, endometrial, ovarian, liver, or uterine cancer -diabetes -gallbladder disease -heart disease or recent heart attack -high blood pressure -high cholesterol -kidney disease -liver disease -migraine headaches -stroke -systemic lupus erythematosus (SLE) -tobacco smoker -an unusual or allergic reaction to estrogens, progestins, other medicines, foods, dyes, or preservatives -pregnant or trying to get pregnant -breast-feeding How should I use this medicine? Take this medicine by mouth. To reduce nausea, this medicine may be taken with food. Follow the directions on the prescription label. Take this medicine at the same time each day and in the order directed on the package. Do not take your medicine more often than directed. Contact your pediatrician regarding the use of this medicine in children. Special care may be needed. This medicine has been used in female children who have started  having menstrual periods. A patient package insert for the product will be given with each prescription and refill. Read this sheet carefully each time. The sheet may change frequently. Overdosage: If you think you have taken too much of this medicine contact a poison control center or emergency room at once. NOTE: This medicine is only for you. Do not share this medicine with others. What if I miss a dose? If you miss a dose, refer to the patient information sheet you received with your medicine for direction. If you miss more than one pill, this medicine may not be as effective and you may need to use another form of birth control. What may interact with this medicine? Do not take this medicine with the following medication: -dasabuvir; ombitasvir; paritaprevir; ritonavir -ombitasvir; paritaprevir; ritonavir This medicine may also interact with the following medications: -acetaminophen -antibiotics or medicines for infections, especially rifampin, rifabutin, rifapentine, and griseofulvin, and possibly penicillins or tetracyclines -aprepitant -ascorbic acid (vitamin C) -atorvastatin -barbiturate medicines, such as phenobarbital -bosentan -carbamazepine -caffeine -clofibrate -cyclosporine -dantrolene -doxercalciferol -felbamate -grapefruit juice -hydrocortisone -medicines for anxiety or sleeping problems, such as diazepam or temazepam -medicines for diabetes, including pioglitazone -mineral oil -modafinil -mycophenolate -nefazodone -oxcarbazepine -phenytoin -prednisolone -ritonavir or other medicines for HIV infection or AIDS -rosuvastatin -selegiline -soy isoflavones supplements -St. John's wort -tamoxifen or raloxifene -theophylline -thyroid hormones -topiramate -warfarin This list may not describe all possible interactions. Give your health care provider a list of all the medicines, herbs, non-prescription drugs, or dietary supplements you use. Also tell them if you  smoke, drink alcohol, or use illegal drugs. Some items may interact with your medicine. What should I watch for while using this medicine? Visit your doctor or health care professional for regular checks on your progress. You will need a regular breast and pelvic exam and Pap smear while on this medicine. You  should also discuss the need for regular mammograms with your health care professional, and follow his or her guidelines for these tests. This medicine can make your body retain fluid, making your fingers, hands, or ankles swell. Your blood pressure can go up. Contact your doctor or health care professional if you feel you are retaining fluid. Use an additional method of contraception during the first cycle that you take these tablets. If you have any reason to think you are pregnant, stop taking this medicine right away and contact your doctor or health care professional. If you are taking this medicine for hormone related problems, it may take several cycles of use to see improvement in your condition. Do not use this product if you smoke and are over 70 years of age. Smoking increases the risk of getting a blood clot or having a stroke while you are taking birth control pills, especially if you are more than 30 years old. If you are a smoker who is 86 years of age or younger, you are strongly advised not to smoke while taking birth control pills. This medicine can make you more sensitive to the sun. Keep out of the sun. If you cannot avoid being in the sun, wear protective clothing and use sunscreen. Do not use sun lamps or tanning beds/booths. If you wear contact lenses and notice visual changes, or if the lenses begin to feel uncomfortable, consult your eye care specialist. In some women, tenderness, swelling, or minor bleeding of the gums may occur. Notify your dentist if this happens. Brushing and flossing your teeth regularly may help limit this. See your dentist regularly and inform your  dentist of the medicines you are taking. If you are going to have elective surgery, you may need to stop taking this medicine before the surgery. Consult your health care professional for advice. This medicine does not protect you against HIV infection (AIDS) or any other sexually transmitted diseases. What side effects may I notice from receiving this medicine? Side effects that you should report to your doctor or health care professional as soon as possible: -breast tissue changes or discharge -changes in vaginal bleeding during your period or between your periods -chest pain -coughing up blood -dizziness or fainting spells -headaches or migraines -leg, arm or groin pain -severe or sudden headaches -stomach pain (severe) -sudden shortness of breath -sudden loss of coordination, especially on one side of the body -speech problems -symptoms of vaginal infection like itching, irritation or unusual discharge -tenderness in the upper abdomen -vomiting -weakness or numbness in the arms or legs, especially on one side of the body -yellowing of the eyes or skin Side effects that usually do not require medical attention (report to your doctor or health care professional if they continue or are bothersome): -breakthrough bleeding and spotting that continues beyond the 3 initial cycles of pills -breast tenderness -mood changes, anxiety, depression, frustration, anger, or emotional outbursts -increased sensitivity to sun or ultraviolet light -nausea -skin rash, acne, or brown spots on the skin -weight gain (slight) This list may not describe all possible side effects. Call your doctor for medical advice about side effects. You may report side effects to FDA at 1-800-FDA-1088. Where should I keep my medicine? Keep out of the reach of children. Store at room temperature between 15 and 30 degrees C (59 and 86 degrees F). Throw away any unused medicine after the expiration date. NOTE: This sheet  is a summary. It may not cover all possible information. If  you have questions about this medicine, talk to your doctor, pharmacist, or health care provider.  2019 Elsevier/Gold Standard (2015-11-27 08:09:09) Breast Tenderness Breast tenderness is a common problem for women of all ages. Breast tenderness may cause mild discomfort to severe pain. The pain usually comes and goes in association with your menstrual cycle, but it can be constant. Breast tenderness has many possible causes, including hormone changes and some medicines. Your health care provider may order tests, such as a mammogram or an ultrasound, to check for any unusual findings. Having breast tenderness usually does not mean that you have breast cancer. Follow these instructions at home: Sometimes, reassurance that you do not have breast cancer is all that is needed. In general, follow these home care instructions: Managing pain and discomfort   If directed, apply ice to the area: ? Put ice in a plastic bag. ? Place a towel between your skin and the bag. ? Leave the ice on for 20 minutes, 2-3 times a day.  Make sure you are wearing a supportive bra, especially during exercise. You may also want to wear a supportive bra while sleeping if your breasts are very tender. Medicines  Take over-the-counter and prescription medicines only as told by your health care provider. If the cause of your pain is infection, you may be prescribed an antibiotic medicine.  If you were prescribed an antibiotic, take it as told by your health care provider. Do not stop taking the antibiotic even if you start to feel better. General instructions   Your health care provider may recommend that you reduce the amount of fat in your diet. You can do this by: ? Limiting fried foods. ? Cooking foods using methods, such as baking, boiling, grilling, and broiling.  Decrease the amount of caffeine in your diet. You can do this by drinking more water and  choosing caffeine-free options.  Keep a log of the days and times when your breasts are most tender.  Ask your health care provider how to do breast exams at home. This will help you notice if you have an unusual growth or lump. Contact a health care provider if:  Any part of your breast is hard, red, and hot to the touch. This may be a sign of infection.  You are not breastfeeding and you have fluid, especially blood or pus, coming out of your nipples.  You have a fever.  You have a new or painful lump in your breast that remains after your menstrual period ends.  Your pain does not improve or it gets worse.  Your pain is interfering with your daily activities. This information is not intended to replace advice given to you by your health care provider. Make sure you discuss any questions you have with your health care provider. Document Released: 02/29/2008 Document Revised: 12/15/2015 Document Reviewed: 12/15/2015 Elsevier Interactive Patient Education  2019 [place] 18-39 Years, Female Preventive care refers to lifestyle choices and visits with your health care provider that can promote health and wellness. What does preventive care include?   A yearly physical exam. This is also called an annual well check.  Dental exams once or twice a year.  Routine eye exams. Ask your health care provider how often you should have your eyes checked.  Personal lifestyle choices, including: ? Daily care of your teeth and gums. ? Regular physical activity. ? Eating a healthy diet. ? Avoiding tobacco and drug use. ? Limiting alcohol use. ? Practicing safe  sex. ? Taking vitamin and mineral supplements as recommended by your health care provider. What happens during an annual well check? The services and screenings done by your health care provider during your annual well check will depend on your age, overall health, lifestyle risk factors, and family history of  disease. Counseling Your health care provider may ask you questions about your:  Alcohol use.  Tobacco use.  Drug use.  Emotional well-being.  Home and relationship well-being.  Sexual activity.  Eating habits.  Work and work Statistician.  Method of birth control.  Menstrual cycle.  Pregnancy history. Screening You may have the following tests or measurements:  Height, weight, and BMI.  Diabetes screening. This is done by checking your blood sugar (glucose) after you have not eaten for a while (fasting).  Blood pressure.  Lipid and cholesterol levels. These may be checked every 5 years starting at age 6.  Skin check.  Hepatitis C blood test.  Hepatitis B blood test.  Sexually transmitted disease (STD) testing.  BRCA-related cancer screening. This may be done if you have a family history of breast, ovarian, tubal, or peritoneal cancers.  Pelvic exam and Pap test. This may be done every 3 years starting at age 29. Starting at age 38, this may be done every 5 years if you have a Pap test in combination with an HPV test. Discuss your test results, treatment options, and if necessary, the need for more tests with your health care provider. Vaccines Your health care provider may recommend certain vaccines, such as:  Influenza vaccine. This is recommended every year.  Tetanus, diphtheria, and acellular pertussis (Tdap, Td) vaccine. You may need a Td booster every 10 years.  Varicella vaccine. You may need this if you have not been vaccinated.  HPV vaccine. If you are 90 or younger, you may need three doses over 6 months.  Measles, mumps, and rubella (MMR) vaccine. You may need at least one dose of MMR. You may also need a second dose.  Pneumococcal 13-valent conjugate (PCV13) vaccine. You may need this if you have certain conditions and were not previously vaccinated.  Pneumococcal polysaccharide (PPSV23) vaccine. You may need one or two doses if you smoke  cigarettes or if you have certain conditions.  Meningococcal vaccine. One dose is recommended if you are age 79-21 years and a first-year college student living in a residence hall, or if you have one of several medical conditions. You may also need additional booster doses.  Hepatitis A vaccine. You may need this if you have certain conditions or if you travel or work in places where you may be exposed to hepatitis A.  Hepatitis B vaccine. You may need this if you have certain conditions or if you travel or work in places where you may be exposed to hepatitis B.  Haemophilus influenzae type b (Hib) vaccine. You may need this if you have certain risk factors. Talk to your health care provider about which screenings and vaccines you need and how often you need them. This information is not intended to replace advice given to you by your health care provider. Make sure you discuss any questions you have with your health care provider. Document Released: 05/14/2001 Document Revised: 10/29/2016 Document Reviewed: 01/17/2015 Elsevier Interactive Patient Education  2019 [employer].

## 2018-09-22 LAB — CYTOLOGY - PAP
Diagnosis: NEGATIVE
HPV: NOT DETECTED

## 2019-01-15 IMAGING — US US OB < 14 WEEKS - US OB TV
1 series · 14 of 28 positions shown · non-contrast
Comparison: 04/26/2014 and pelvic ultrasound dated 10/11/2005..

CLINICAL DATA: Light brown vaginal spotting for the past 3 weeks.
Seven weeks and 3 days pregnant by last menstrual period.
Quantitative beta HCG [DATE].

EXAM:
OBSTETRIC <14 WK US AND TRANSVAGINAL OB US
TECHNIQUE: Both transabdominal and transvaginal ultrasound examinations were
performed for complete evaluation of the gestation as well as the
maternal uterus, adnexal regions, and pelvic cul-de-sac.
Transvaginal technique was performed to assess early pregnancy.

[Series 1: us ob < 14 weeks - us ob tv · 0.14mm/px · 93 acquisitions, 14 frames shown]
[im 4/93]
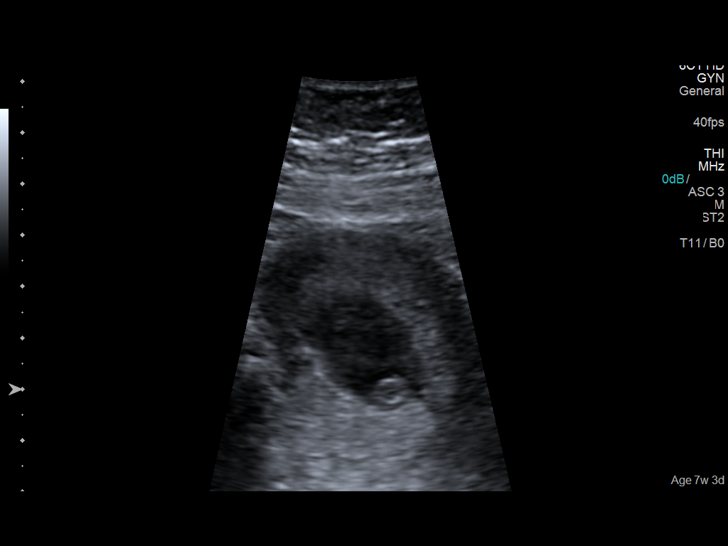
[im 11/93]
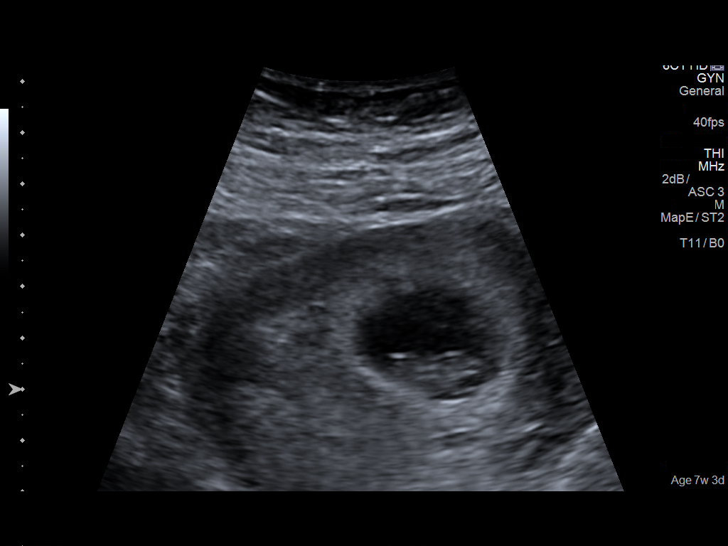
[im 18/93]
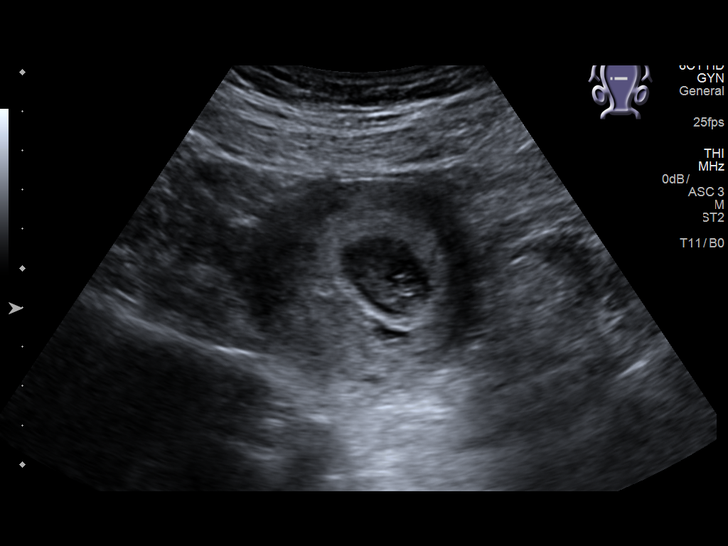
[im 24/93]
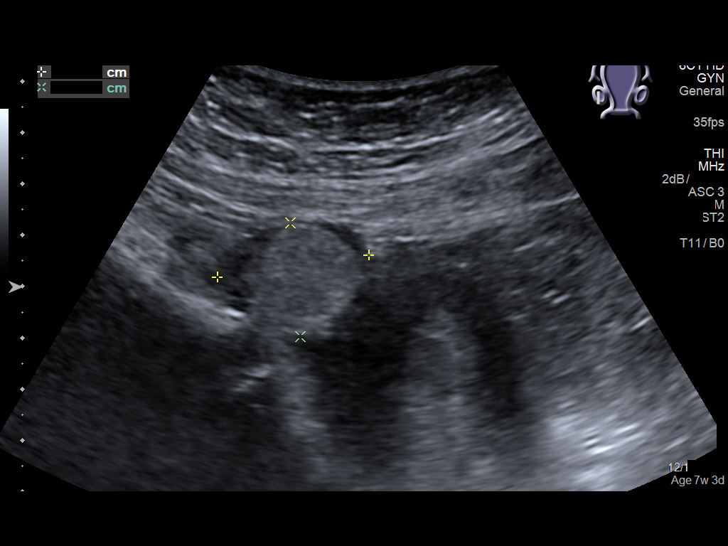
[im 31/93]
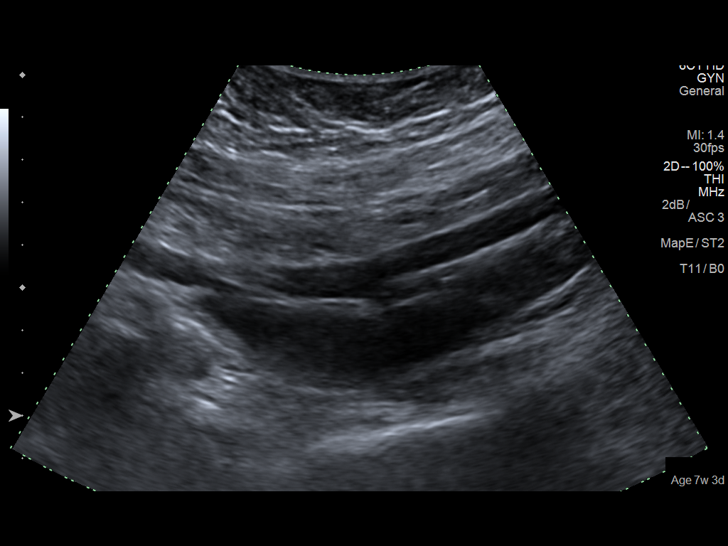
[im 38/93]
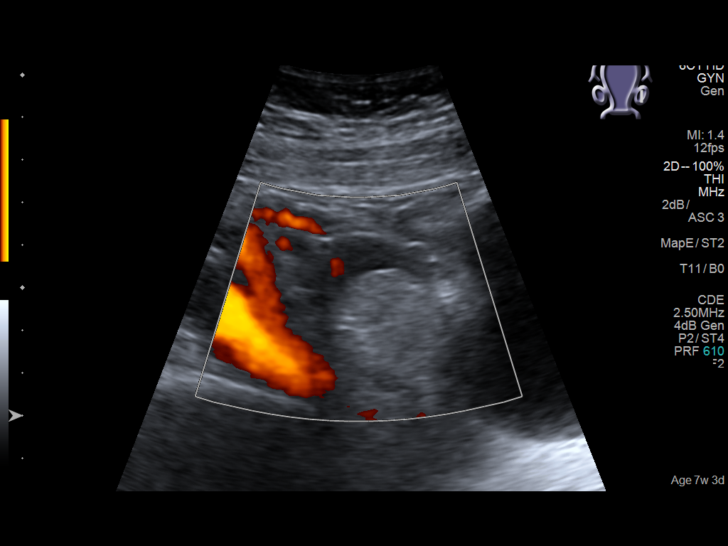
[im 45/93]
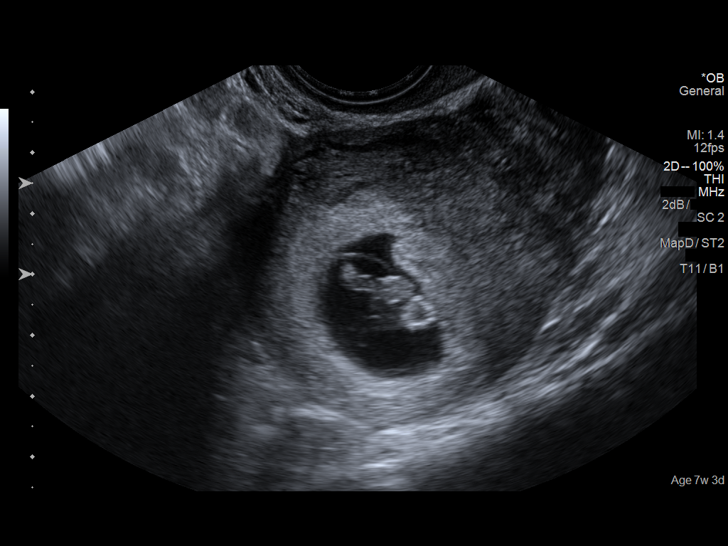
[im 52/93]
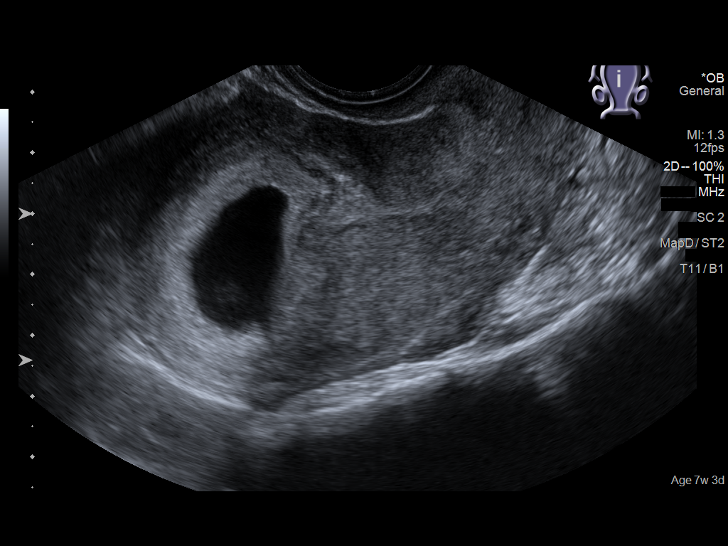
[im 58/93]
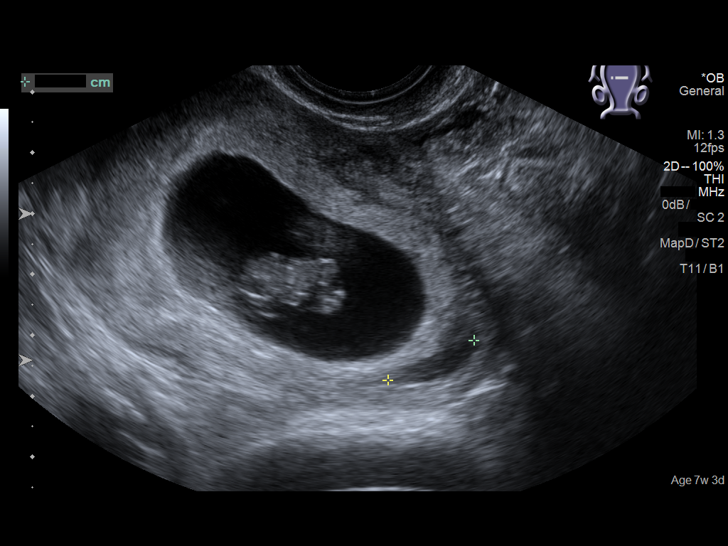
[im 65/93]
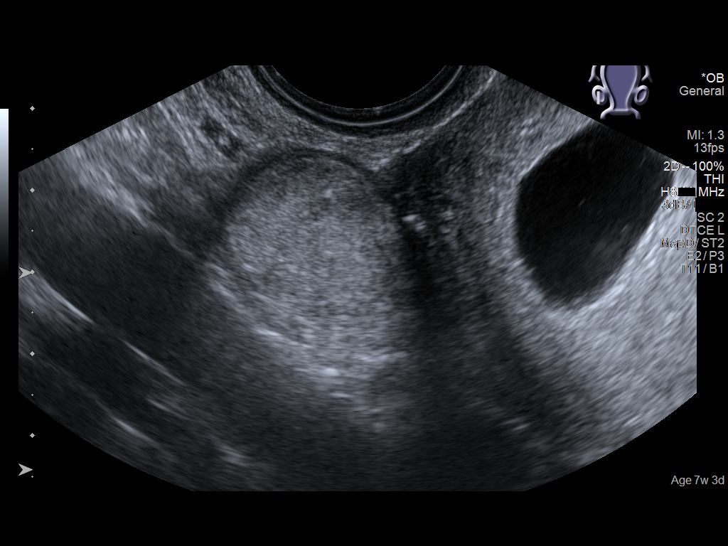
[im 72/93]
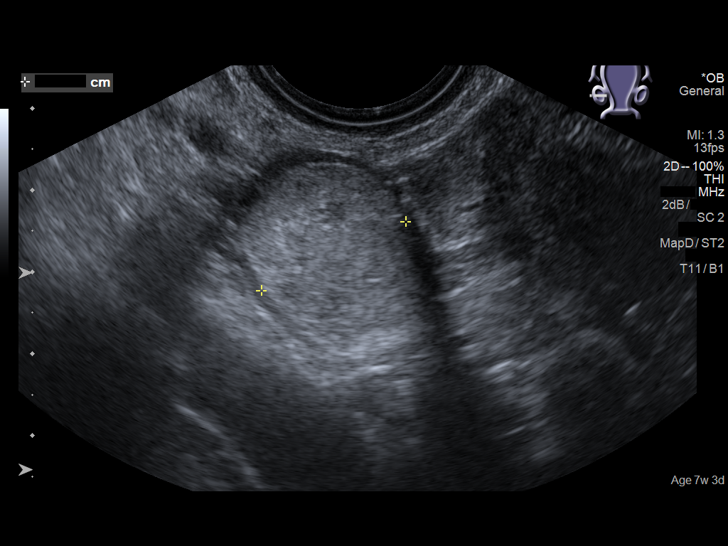
[im 79/93]
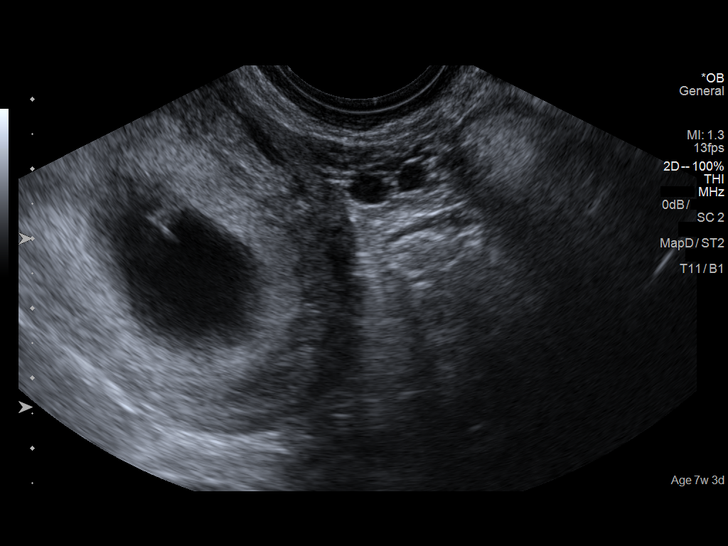
[im 86/93]
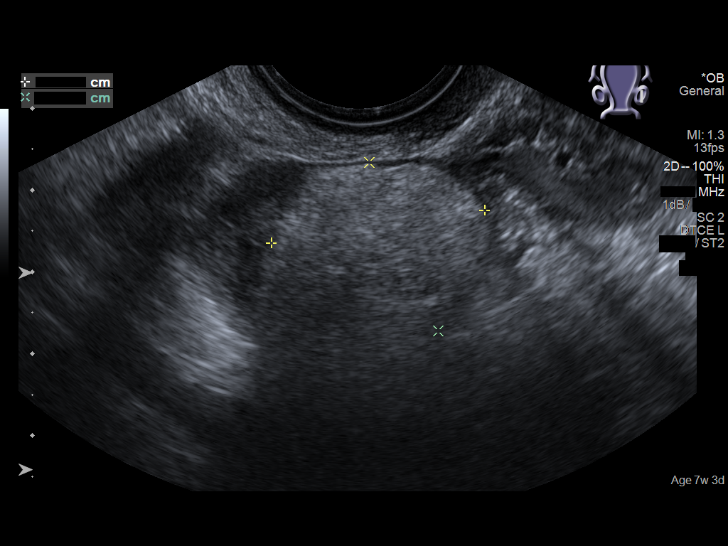
[im 93/93]
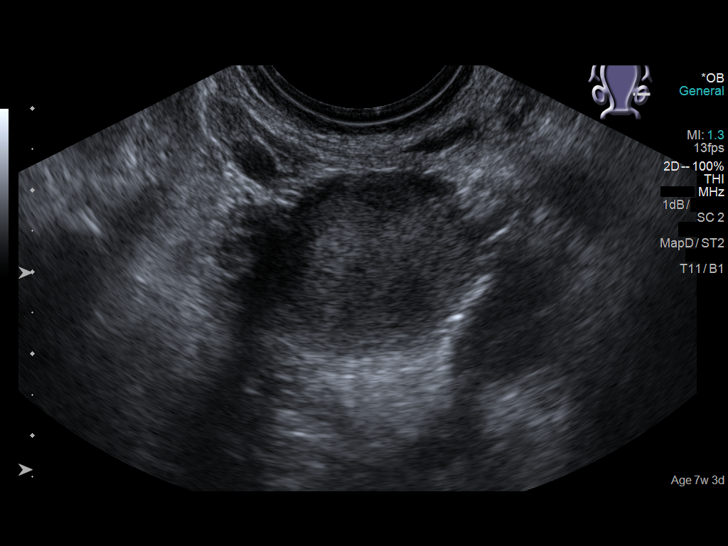

[14 of 28 positions shown; findings below may reference images not displayed]

FINDINGS: Intrauterine gestational sac: Visualized

Yolk sac:  Visualized

Embryo:  Visualized

Cardiac Activity: Visualized

Heart Rate: 171 bpm

CRL:  17.1 mm   8 w   1 d                  US EDC: 12/11/2017

Subchorionic hemorrhage:  Small

Maternal uterus/adnexae: 2.4 x 2.3 x 2.0 cm oval, circumscribed,
echogenic mass in the right ovary. 2.6 x 2.3 x 2.2 cm
similar-appearing mass in the left ovary. No free peritoneal fluid.
IMPRESSION: 1. Single live intrauterine gestation with an estimated gestational
age of 8 weeks and 1 day.
2. Small subchorionic hemorrhage.
3. Probable bilateral ovarian dermoids.

## 2019-04-12 ENCOUNTER — Other Ambulatory Visit: Payer: Self-pay

## 2019-04-12 MED ORDER — PANTOPRAZOLE SODIUM 40 MG PO TBEC: 40.0000 mg | DELAYED_RELEASE_TABLET | Freq: Every day | ORAL | 0 refills | Status: DC

## 2019-04-12 NOTE — Telephone Encounter (Signed)
Refill on pantoprazole sent to [facility] per faxed request.

## 2019-06-14 ENCOUNTER — Telehealth: Payer: Self-pay

## 2019-06-14 ENCOUNTER — Other Ambulatory Visit: Payer: Self-pay

## 2019-06-14 NOTE — Telephone Encounter (Signed)
mychart message sent to patient in response to a refill request received.

## 2019-07-09 ENCOUNTER — Other Ambulatory Visit: Payer: Self-pay

## 2019-07-09 MED ORDER — CLINDAMYCIN PHOSPHATE 1 % EX LOTN: TOPICAL_LOTION | Freq: Every day | CUTANEOUS | 2 refills | Status: AC

## 2019-07-09 NOTE — Telephone Encounter (Signed)
Patient called in stating she never received the Clindamycin Lotion sent in February in our legacy system. RX sent in today and left patient message advising RX sent to [facility] in [place].

## 2019-07-13 ENCOUNTER — Ambulatory Visit: Payer: Self-pay | Admitting: Dermatology

## 2019-09-17 ENCOUNTER — Other Ambulatory Visit: Payer: Self-pay

## 2019-09-17 MED ORDER — NORGESTIMATE-ETH ESTRADIOL 0.25-35 MG-MCG PO TABS: 1.0000 | ORAL_TABLET | Freq: Every day | ORAL | 0 refills | Status: DC

## 2019-10-29 ENCOUNTER — Encounter: Payer: Self-pay | Admitting: Certified Nurse Midwife

## 2019-10-29 ENCOUNTER — Ambulatory Visit (INDEPENDENT_AMBULATORY_CARE_PROVIDER_SITE_OTHER): Payer: 59 | Admitting: Certified Nurse Midwife

## 2019-10-29 VITALS — BP 113/80 | HR 59 | Ht 65.0 in | Wt 152.5 lb

## 2019-10-29 DIAGNOSIS — R319 Hematuria, unspecified: Secondary | ICD-10-CM | POA: Diagnosis not present

## 2019-10-29 DIAGNOSIS — Z01419 Encounter for gynecological examination (general) (routine) without abnormal findings: Secondary | ICD-10-CM

## 2019-10-29 DIAGNOSIS — Z3041 Encounter for surveillance of contraceptive pills: Secondary | ICD-10-CM

## 2019-10-29 MED ORDER — NORGESTIMATE-ETH ESTRADIOL 0.25-35 MG-MCG PO TABS: 1.0000 | ORAL_TABLET | Freq: Every day | ORAL | 3 refills | Status: DC

## 2019-10-29 NOTE — Progress Notes (Signed)
Pt is here today for her annual. She feels well with no complaints. She does not take her birth control. The last pill she took was on 10/09/19. LMP was 10/19/19 and lasted for about 3 days.

## 2019-10-29 NOTE — Patient Instructions (Addendum)
Preventive Care 21-31 Years Old, Female Preventive care refers to visits with your health care provider and lifestyle choices that can promote health and wellness. This includes:  A yearly physical exam. This may also be called an annual well check.  Regular dental visits and eye exams.  Immunizations.  Screening for certain conditions.  Healthy lifestyle choices, such as eating a healthy diet, getting regular exercise, not using drugs or products that contain nicotine and tobacco, and limiting alcohol use. What can I expect for my preventive care visit? Physical exam Your health care provider will check your:  Height and weight. This may be used to calculate body mass index (BMI), which tells if you are at a healthy weight.  Heart rate and blood pressure.  Skin for abnormal spots. Counseling Your health care provider may ask you questions about your:  Alcohol, tobacco, and drug use.  Emotional well-being.  Home and relationship well-being.  Sexual activity.  Eating habits.  Work and work environment.  Method of birth control.  Menstrual cycle.  Pregnancy history. What immunizations do I need?  Influenza (flu) vaccine  This is recommended every year. Tetanus, diphtheria, and pertussis (Tdap) vaccine  You may need a Td booster every 10 years. Varicella (chickenpox) vaccine  You may need this if you have not been vaccinated. Human papillomavirus (HPV) vaccine  If recommended by your health care provider, you may need three doses over 6 months. Measles, mumps, and rubella (MMR) vaccine  You may need at least one dose of MMR. You may also need a second dose. Meningococcal conjugate (MenACWY) vaccine  One dose is recommended if you are age 19-21 years and a first-year college student living in a residence hall, or if you have one of several medical conditions. You may also need additional booster doses. Pneumococcal conjugate (PCV13) vaccine  You may need  this if you have certain conditions and were not previously vaccinated. Pneumococcal polysaccharide (PPSV23) vaccine  You may need one or two doses if you smoke cigarettes or if you have certain conditions. Hepatitis A vaccine  You may need this if you have certain conditions or if you travel or work in places where you may be exposed to hepatitis A. Hepatitis B vaccine  You may need this if you have certain conditions or if you travel or work in places where you may be exposed to hepatitis B. Haemophilus influenzae type b (Hib) vaccine  You may need this if you have certain conditions. You may receive vaccines as individual doses or as more than one vaccine together in one shot (combination vaccines). Talk with your health care provider about the risks and benefits of combination vaccines. What tests do I need?  Blood tests  Lipid and cholesterol levels. These may be checked every 5 years starting at age 20.  Hepatitis C test.  Hepatitis B test. Screening  Diabetes screening. This is done by checking your blood sugar (glucose) after you have not eaten for a while (fasting).  Sexually transmitted disease (STD) testing.  BRCA-related cancer screening. This may be done if you have a family history of breast, ovarian, tubal, or peritoneal cancers.  Pelvic exam and Pap test. This may be done every 3 years starting at age 21. Starting at age 30, this may be done every 5 years if you have a Pap test in combination with an HPV test. Talk with your health care provider about your test results, treatment options, and if necessary, the need for more tests.   Follow these instructions at home: Eating and drinking   Eat a diet that includes fresh fruits and vegetables, whole grains, lean protein, and low-fat dairy.  Take vitamin and mineral supplements as recommended by your health care provider.  Do not drink alcohol if: ? Your health care provider tells you not to drink. ? You are  pregnant, may be pregnant, or are planning to become pregnant.  If you drink alcohol: ? Limit how much you have to 0-1 drink a day. ? Be aware of how much alcohol is in your drink. In the [place] one drink equals one 12 oz bottle of beer (355 mL), one 5 oz glass of wine (148 mL), or one 1 oz glass of hard liquor (44 mL). Lifestyle  Take daily care of your teeth and gums.  Stay active. Exercise for at least 30 minutes on 5 or more days each week.  Do not use any products that contain nicotine or tobacco, such as cigarettes, e-cigarettes, and chewing tobacco. If you need help quitting, ask your health care provider.  If you are sexually active, practice safe sex. Use a condom or other form of birth control (contraception) in order to prevent pregnancy and STIs (sexually transmitted infections). If you plan to become pregnant, see your health care provider for a preconception visit. What's next?  Visit your health care provider once a year for a well check visit.  Ask your health care provider how often you should have your eyes and teeth checked.  Stay up to date on all vaccines. This information is not intended to replace advice given to you by your health care provider. Make sure you discuss any questions you have with your health care provider. Document Revised: 11/27/2017 Document Reviewed: 11/27/2017 Elsevier Patient Education  [place]. Lactic acid; Citric acid; Potassium bitartrate vaginal gel What is this medicine? LACTIC ACID; CITRIC ACID; POTASSIUM BITARTRATE (LAK tuhk AS id; SIH trik AS id; poe TASS ee um bi TAAR treit) is used for birth control when you have sexual intercourse to help prevent pregnancy. This medicine may be used for other purposes; ask your health care provider or pharmacist if you have questions. COMMON BRAND NAME(S): PHEXXI What should I tell my health care provider before I take this medicine? They need to know if you have any of these  conditions:  frequent kidney or urinary tract infections  HIV or AIDS  pelvic or vaginal infection  sexually transmitted disease, like herpes, gonorrhea, or chlamydia  an unusual or allergic reaction to lactic acid, citric acid, potassium bitartrate, other medicines, foods, dyes, or preservatives  pregnant or trying to get pregnant  breast-feeding How should I use this medicine? This medicine is for use in the vagina only. Follow the directions on the prescription label. Wash hands before and after use. To prevent pregnancy, it is very important that this product is used properly. This product must be applied before sexual contact begins. Do not use it in the rectum. Make sure you carefully read and follow the instructions that come with the product. If you have vaginal sex more than 1 time within 1 hour, you must use a new applicator. Use exactly as directed. Talk to your pediatrician regarding the use of this medicine in children. Special care may be needed. Overdosage: If you think you have taken too much of this medicine contact a poison control center or emergency room at once. NOTE: This medicine is only for you. Do not share this medicine  with others. What if I miss a dose? Use before each time you have sexual intercourse as directed on the label. If you miss a dose, you may become pregnant. What may interact with this medicine? Interactions are not expected. This birth control may be used with other medicines used in the vagina to treat infections including miconazole, metronidazole, and tioconazole. If you have questions ask your doctor or pharmacist. Do not use any other vaginal products at the same time without talking to your health care professional. This list may not describe all possible interactions. Give your health care provider a list of all the medicines, herbs, non-prescription drugs, or dietary supplements you use. Also tell them if you smoke, drink alcohol, or use  illegal drugs. Some items may interact with your medicine. What should I watch for while using this medicine? This product does not protect you against HIV infection (AIDS) or other sexually transmitted diseases. This product may be used at any time of your menstrual cycle. This product may be used as soon as your healthcare provider tells you it is safe for you to have sex after childbirth, abortion, or miscarriage. This product may be used with most hormonal birth control methods; a vaginal diaphragm; or latex, polyurethane and polyisoprene condoms. Avoid using this product with contraceptive vaginal rings. What side effects may I notice from receiving this medicine? Side effects that you should report to your doctor or health care professional as soon as possible:  allergic reactions like skin rash, itching or hives, swelling  signs and symptoms of infection like fever; chills; pelvic pain; cloudy urine; pain or trouble passing urine  vaginal discharge, itching or odor  vaginal irritation or burning Side effects that usually do not require medical attention (report these to your doctor or health care professional if they continue or are bothersome):  mild vaginal irritation This list may not describe all possible side effects. Call your doctor for medical advice about side effects. You may report side effects to FDA at 1-800-FDA-1088. Where should I keep my medicine? Keep out of the reach of children. Store at room temperature between 15 and 30 degrees C (59 and 86 degrees F). Keep in the foil pouch until ready for use. Follow the directions on the product label. Throw away any unused medicine after the expiration date. NOTE: This sheet is a summary. It may not cover all possible information. If you have questions about this medicine, talk to your doctor, pharmacist, or health care provider.  2020 Elsevier/Gold Standard (2018-08-27 09:34:36)

## 2019-10-29 NOTE — Progress Notes (Signed)
ANNUAL PREVENTATIVE CARE GYN  ENCOUNTER NOTE  Subjective:       Toni Mccormick is a 29 y.o. [phone] female here for a routine annual gynecologic exam.  Current complaints: 1. Needs OCP refill-desires vasectomy 2. Questions UTI since UA showed blood at PCP appointment  Denies difficulty breathing or respiratory distress, chest pain, abdominal pain, excessive vaginal bleeding, dysuria, and leg pain or swelling.    Gynecologic History  Patient's last menstrual period was 10/19/2019.   Contraception: OCP (estrogen/progesterone)   Last Pap: 08/2018. Results were: Neg/Neg  Obstetric History  OB History  Gravida Para Term Preterm AB Living  2 2 1 1   2   SAB TAB Ectopic Multiple Live Births        0 2    # Outcome Date GA Lbr Len/2nd Weight Sex Delivery Anes PTL Lv  2 Term 11/25/17 [redacted]w[redacted]d 04:42 / 00:09 7 lb 3 oz (3.26 kg) M Vag-Spont EPI Y LIV  1 Preterm 04/29/14   5 lb 5 oz (2.41 kg) M Vag-Spont EPI Y LIV     Complications: Premature delivery, Ruptured, membranes, premature    Past Medical History:  Diagnosis Date  . B12 deficiency   . Chicken pox   . Gestational hypertension   . IBS (irritable bowel syndrome)   . Post partum depression     Past Surgical History:  Procedure Laterality Date  . TONSILLECTOMY AND ADENOIDECTOMY Bilateral 1992  . WISDOM TOOTH EXTRACTION      Current Outpatient Medications on File Prior to Visit  Medication Sig Dispense Refill  . clindamycin (CLEOCIN-T) 1 % lotion Apply topically daily. To the affected areas of body 60 mL 2  . cyanocobalamin (,VITAMIN B-12,) 1000 MCG/ML injection Inject 1,000 mcg into the muscle every 14 (fourteen) days.    . pantoprazole (PROTONIX) 40 MG tablet Take 1 tablet (40 mg total) by mouth daily. 90 tablet 0  . rosuvastatin (CRESTOR) 5 MG tablet Take by mouth.    . venlafaxine XR (EFFEXOR-XR) 37.5 MG 24 hr capsule Take 37.5 mg by mouth daily.     No current facility-administered medications on file prior to  visit.    No Known Allergies  Social History   Socioeconomic History  . Marital status: Married    Spouse name: Not on file  . Number of children: Not on file  . Years of education: Not on file  . Highest education level: Not on file  Occupational History  . Not on file  Tobacco Use  . Smoking status: Never Smoker  . Smokeless tobacco: Never Used  Vaping Use  . Vaping Use: Never used  Substance and Sexual Activity  . Alcohol use: Not Currently    Comment: Rare  . Drug use: No  . Sexual activity: Yes    Birth control/protection: Pill  Other Topics Concern  . Not on file  Social History Narrative  . Not on file   Social Determinants of Health   Financial Resource Strain:   . Difficulty of Paying Living Expenses:   Food Insecurity:   . Worried About Charity fundraiser in the Last Year:   . Arboriculturist in the Last Year:   Transportation Needs:   . Film/video editor (Medical):   Marland Kitchen Lack of Transportation (Non-Medical):   Physical Activity:   . Days of Exercise per Week:   . Minutes of Exercise per Session:   Stress:   . Feeling of Stress :   Social Connections:   .  Frequency of Communication with Friends and Family:   . Frequency of Social Gatherings with Friends and Family:   . Attends Religious Services:   . Active Member of Clubs or Organizations:   . Attends Archivist Meetings:   Marland Kitchen Marital Status:   Intimate Partner Violence:   . Fear of Current or Ex-Partner:   . Emotionally Abused:   Marland Kitchen Physically Abused:   . Sexually Abused:     Family History  Problem Relation Age of Onset  . Hyperlipidemia Father   . Hypertension Father   . Hyperlipidemia Maternal Grandmother   . Cancer Maternal Grandfather        lung  . Diabetes Paternal Grandmother   . Arthritis Paternal Grandfather   . Cancer Paternal Grandfather        lung  . Breast cancer Neg Hx   . Ovarian cancer Neg Hx   . Colon cancer Neg Hx     The following portions of the  patient's history were reviewed and updated as appropriate: allergies, current medications, past family history, past medical history, past social history, past surgical history and problem list.  Review of Systems  ROS negative except as noted above. Information obtained from patient.    Objective:   BP 113/80   Pulse 59   Ht 5\' 5"  (1.651 m)   Wt 152 lb 8 oz (69.2 kg)   LMP 10/19/2019   BMI 25.38 kg/m    CONSTITUTIONAL: Well-developed, well-nourished female in no acute distress.   PSYCHIATRIC: Normal mood and affect. Normal behavior. Normal  judgment and thought content.  [place]: Alert and oriented to person, place, and time. Normal muscle tone coordination. No cranial nerve deficit noted.  HENT:  Normocephalic, atraumatic, External right and left ear normal.   EYES: Conjunctivae and EOM are normal. Pupils are equal and round.   NECK: Normal range of motion, supple, no masses.  Normal thyroid.   SKIN: Skin is warm and dry. No rash noted. Not diaphoretic. No erythema. No pallor.  CARDIOVASCULAR: Normal heart rate noted, regular rhythm, no murmur.  RESPIRATORY: Clear to auscultation bilaterally. Effort and breath sounds normal, no problems with respiration noted.  BREASTS: Symmetric in size. No masses, skin changes, nipple drainage, or lymphadenopathy.  ABDOMEN: Soft, normal bowel sounds, no distention noted.  No tenderness, rebound or guarding.   PELVIC:  External Genitalia: Normal  Vagina: Normal  Cervix: Normal  Uterus: Normal  Adnexa: Normal  MUSCULOSKELETAL: Normal range of motion. No tenderness.  No cyanosis, clubbing, or edema.  2+ distal pulses.  LYMPHATIC: No Axillary, Supraclavicular, or Inguinal Adenopathy.  Assessment:   Annual gynecologic examination 29 y.o.   Contraception: OCP (estrogen/progesterone)   Normal BMI   Problem List Items Addressed This Visit    None    Visit Diagnoses    Well woman exam    -  Primary   Hematuria, unspecified  type       Relevant Orders   Urine Culture   Surveillance for birth control, oral contraceptives          Plan:   Pap: Not needed  Labs: See orders  Routine preventative health maintenance measures emphasized: Exercise/Diet/Weight control, Tobacco Warnings, Alcohol/Substance use risks and Stress Management; see AVS   Rx Ortho-Cylcen, see orders  Information given regarding Phexxi, see AVS  Reviewed red flag symptoms and when to call  Return to [place] for [employer] or sooner   Dani Gobble, CNM  [facility],  [facility] 10/29/19 10:58 AM

## 2019-10-31 LAB — URINE CULTURE

## 2020-01-11 ENCOUNTER — Other Ambulatory Visit: Payer: Self-pay

## 2020-01-11 MED ORDER — NORGESTIMATE-ETH ESTRADIOL 0.25-35 MG-MCG PO TABS: 1.0000 | ORAL_TABLET | Freq: Every day | ORAL | 3 refills | Status: DC

## 2020-10-08 ENCOUNTER — Other Ambulatory Visit: Payer: Self-pay | Admitting: Certified Nurse Midwife

## 2020-10-30 ENCOUNTER — Encounter: Payer: 59 | Admitting: Certified Nurse Midwife

## 2020-10-31 ENCOUNTER — Encounter: Payer: Self-pay | Admitting: Certified Nurse Midwife

## 2020-10-31 ENCOUNTER — Ambulatory Visit (INDEPENDENT_AMBULATORY_CARE_PROVIDER_SITE_OTHER): Payer: 59 | Admitting: Certified Nurse Midwife

## 2020-10-31 ENCOUNTER — Other Ambulatory Visit: Payer: Self-pay

## 2020-10-31 ENCOUNTER — Other Ambulatory Visit (HOSPITAL_COMMUNITY)
Admission: RE | Admit: 2020-10-31 | Discharge: 2020-10-31 | Disposition: A | Discharge status: Home / Self Care | Payer: Commercial Managed Care - HMO | Source: Ambulatory Visit | Attending: Certified Nurse Midwife | Admitting: Certified Nurse Midwife

## 2020-10-31 VITALS — BP 122/83 | HR 66 | Ht 65.0 in | Wt 167.7 lb

## 2020-10-31 DIAGNOSIS — Z01419 Encounter for gynecological examination (general) (routine) without abnormal findings: Secondary | ICD-10-CM | POA: Insufficient documentation

## 2020-10-31 DIAGNOSIS — Z124 Encounter for screening for malignant neoplasm of cervix: Secondary | ICD-10-CM

## 2020-10-31 MED ORDER — NORGESTIMATE-ETH ESTRADIOL 0.25-35 MG-MCG PO TABS: 1.0000 | ORAL_TABLET | Freq: Every day | ORAL | 11 refills | Status: DC

## 2020-10-31 NOTE — Progress Notes (Signed)
GYNECOLOGY ANNUAL PREVENTATIVE CARE ENCOUNTER NOTE  History:     Toni Mccormick is a 30 y.o. [phone] female here for a routine annual gynecologic exam.  Current complaints: low libido.   Denies abnormal vaginal bleeding, discharge, pelvic pain, problems with intercourse or other gynecologic concerns.     Social Relationship: married  Living:spouse, children , and her parents Work: stay at home mom Exercise: 4 x wk 30-45 min Smoke/Alcohol/drug use: rare alcohol use, denies drugs and smoking   Gynecologic History Patient's last menstrual period was 10/10/2020 (approximate). Contraception: OCP (estrogen/progesterone) Last Pap: 09/19/18. Results were: normal with negative HPV Last mammogram: n/a .   Obstetric History OB History  Gravida Para Term Preterm AB Living  '2 2 1 1   2  '$ SAB IAB Ectopic Multiple Live Births        0 2    # Outcome Date GA Lbr Len/2nd Weight Sex Delivery Anes PTL Lv  2 Term 11/25/17 [id number]:42 / 00:09 7 lb 3 oz (3.26 kg) M Vag-Spont EPI Y LIV  1 Preterm 04/29/14   5 lb 5 oz (2.41 kg) M Vag-Spont EPI Y LIV     Complications: Premature delivery, Ruptured, membranes, premature    Past Medical History:  Diagnosis Date   Anxiety    B12 deficiency    Chicken pox    Depression    Gestational hypertension    IBS (irritable bowel syndrome)    Post partum depression     Past Surgical History:  Procedure Laterality Date   TONSILLECTOMY AND ADENOIDECTOMY Bilateral 1992   WISDOM TOOTH EXTRACTION      Current Outpatient Medications on File Prior to Visit  Medication Sig Dispense Refill   cyanocobalamin (,VITAMIN B-12,) 1000 MCG/ML injection Inject 1,000 mcg into the muscle every 14 (fourteen) days.     pantoprazole (PROTONIX) 40 MG tablet Take 1 tablet (40 mg total) by mouth daily. 90 tablet 0   SPRINTEC 28 0.25-35 MG-MCG tablet TAKE 1 TABLET BY MOUTH  DAILY 28 tablet 0   rosuvastatin (CRESTOR) 5 MG tablet Take by mouth.     venlafaxine XR  (EFFEXOR-XR) 37.5 MG 24 hr capsule Take 37.5 mg by mouth daily.     No current facility-administered medications on file prior to visit.    No Known Allergies  Social History:  reports that she has never smoked. She has never used smokeless tobacco. She reports previous alcohol use. She reports that she does not use drugs.  Family History  Problem Relation Age of Onset   Hyperlipidemia Father    Hypertension Father    Hyperlipidemia Maternal Grandmother    Cancer Maternal Grandfather        lung   Diabetes Paternal Grandmother    Arthritis Paternal Grandfather    Cancer Paternal Grandfather        lung   Breast cancer Neg Hx    Ovarian cancer Neg Hx    Colon cancer Neg Hx     The following portions of the patient's history were reviewed and updated as appropriate: allergies, current medications, past family history, past medical history, past social history, past surgical history and problem list.  Review of Systems Pertinent items noted in HPI and remainder of comprehensive ROS otherwise negative.  Physical Exam:  BP 122/83   Pulse 66   Ht '5\' 5"'$  (1.651 m)   Wt 167 lb 11.2 oz (76.1 kg)   LMP 10/10/2020 (Approximate)   BMI 27.91 kg/m  CONSTITUTIONAL:  Well-developed, well-nourished female in no acute distress.  HENT:  Normocephalic, atraumatic, External right and left ear normal. Oropharynx is clear and moist EYES: Conjunctivae and EOM are normal. Pupils are equal, round, and reactive to light. No scleral icterus.  NECK: Normal range of motion, supple, no masses.  Normal thyroid.  SKIN: Skin is warm and dry. No rash noted. Not diaphoretic. No erythema. No pallor. MUSCULOSKELETAL: Normal range of motion. No tenderness.  No cyanosis, clubbing, or edema.  2+ distal pulses. NEUROLOGIC: Alert and oriented to person, place, and time. Normal reflexes, muscle tone coordination.  PSYCHIATRIC: Normal mood and affect. Normal behavior. Normal judgment and thought  content. CARDIOVASCULAR: Normal heart rate noted, regular rhythm RESPIRATORY: Clear to auscultation bilaterally. Effort and breath sounds normal, no problems with respiration noted. BREASTS: Symmetric in size. No masses, tenderness, skin changes, nipple drainage, or lymphadenopathy bilaterally.  ABDOMEN: Soft, no distention noted.  No tenderness, rebound or guarding.  PELVIC: Normal appearing external genitalia and urethral meatus; normal appearing vaginal mucosa and cervix.  No abnormal discharge noted.  Pap smear obtained. Contact bleeding.Ectropion present. Normal uterine size, no other palpable masses, no uterine or adnexal tenderness.  .   Assessment and Plan:    1. Well woman exam with routine gynecological exam  Pap:Will follow up results of pap smear and manage accordingly.( Pt request done this year). Mammogram : n/a  Labs:declines Refills:ocp Referral:none Routine preventative health maintenance measures emphasized. Please refer to After Visit Summary for other counseling recommendations.      Philip Aspen, CNM [facility]

## 2020-11-05 LAB — CYTOLOGY - PAP
Comment: NEGATIVE
Diagnosis: UNDETERMINED — AB
High risk HPV: NEGATIVE

## 2021-04-17 ENCOUNTER — Encounter: Payer: Self-pay | Admitting: Certified Nurse Midwife

## 2021-04-18 ENCOUNTER — Encounter: Payer: Self-pay | Admitting: Certified Nurse Midwife

## 2021-06-05 ENCOUNTER — Other Ambulatory Visit: Payer: Self-pay

## 2021-06-05 MED ORDER — NORGESTIMATE-ETH ESTRADIOL 0.25-35 MG-MCG PO TABS: 1.0000 | ORAL_TABLET | Freq: Every day | ORAL | 11 refills | Status: DC

## 2021-06-06 ENCOUNTER — Other Ambulatory Visit: Payer: Self-pay

## 2021-06-06 ENCOUNTER — Telehealth: Payer: Self-pay | Admitting: Certified Nurse Midwife

## 2021-06-06 MED ORDER — NORGESTIMATE-ETH ESTRADIOL 0.25-35 MG-MCG PO TABS: 1.0000 | ORAL_TABLET | Freq: Every day | ORAL | 0 refills | Status: DC

## 2021-06-06 NOTE — Telephone Encounter (Signed)
Pt called requesting that her birth control be sent to local pharmacy : [facility] in [place]. Please Advise.  ?

## 2021-06-07 ENCOUNTER — Encounter: Payer: Self-pay | Admitting: Certified Nurse Midwife

## 2021-11-06 ENCOUNTER — Encounter: Payer: 59 | Admitting: Certified Nurse Midwife

## 2021-12-06 ENCOUNTER — Encounter: Payer: Self-pay | Admitting: Certified Nurse Midwife

## 2021-12-06 ENCOUNTER — Other Ambulatory Visit (HOSPITAL_COMMUNITY)
Admission: RE | Admit: 2021-12-06 | Discharge: 2021-12-06 | Disposition: A | Discharge status: Home / Self Care | Payer: 59 | Source: Ambulatory Visit | Attending: Certified Nurse Midwife | Admitting: Certified Nurse Midwife

## 2021-12-06 ENCOUNTER — Ambulatory Visit (INDEPENDENT_AMBULATORY_CARE_PROVIDER_SITE_OTHER): Payer: 59 | Admitting: Certified Nurse Midwife

## 2021-12-06 VITALS — BP 114/74 | HR 69 | Ht 65.0 in | Wt 167.0 lb

## 2021-12-06 DIAGNOSIS — Z124 Encounter for screening for malignant neoplasm of cervix: Secondary | ICD-10-CM | POA: Insufficient documentation

## 2021-12-06 DIAGNOSIS — Z01419 Encounter for gynecological examination (general) (routine) without abnormal findings: Secondary | ICD-10-CM | POA: Diagnosis present

## 2021-12-06 MED ORDER — NORGESTIMATE-ETH ESTRADIOL 0.25-35 MG-MCG PO TABS: 1.0000 | ORAL_TABLET | Freq: Every day | ORAL | 11 refills | Status: AC

## 2021-12-06 NOTE — Progress Notes (Signed)
GYNECOLOGY ANNUAL PREVENTATIVE CARE ENCOUNTER NOTE  History:     Telitha Plath is a 31 y.o. [phone] female here for a routine annual gynecologic exam.  Current complaints: none.   Denies abnormal vaginal bleeding, discharge, pelvic pain, problems with intercourse or other gynecologic concerns.     Social Relationship: married  Living: spouse and children , parents Work: stay at home home Exercise: 5 days a week x 30-45 min Smoke/Alcohol/drug use: rare alcohol use   Gynecologic History No LMP recorded. Contraception: OCP (estrogen/progesterone) Last Pap: 10/31/20. Results were:HPV negative, unsatisfactory for eval.  Last mammogram: n/a.    Upstream - 12/06/21 1053       Pregnancy Intention Screening   Does the patient want to become pregnant in the next year? No    Does the patient's partner want to become pregnant in the next year? No            The pregnancy intention screening data noted above was reviewed. Potential methods of contraception were discussed. The patient elected to proceed with ocp.   Obstetric History OB History  Gravida Para Term Preterm AB Living  '2 2 1 1   2  '$ SAB IAB Ectopic Multiple Live Births        0 2    # Outcome Date GA Lbr Len/2nd Weight Sex Delivery Anes PTL Lv  2 Term 11/25/17 [id number] / 00:09 7 lb 3 oz (3.26 kg) M Vag-Spont EPI Y LIV  1 Preterm 04/29/14   5 lb 5 oz (2.41 kg) M Vag-Spont EPI Y LIV     Complications: Premature delivery, Ruptured, membranes, premature    Past Medical History:  Diagnosis Date   Anxiety    B12 deficiency    Chicken pox    Depression    Gestational hypertension    IBS (irritable bowel syndrome)    Post partum depression     Past Surgical History:  Procedure Laterality Date   TONSILLECTOMY AND ADENOIDECTOMY Bilateral 1992   WISDOM TOOTH EXTRACTION      Current Outpatient Medications on File Prior to Visit  Medication Sig Dispense Refill   cyanocobalamin (,VITAMIN B-12,) 1000  MCG/ML injection Inject 1,000 mcg into the muscle every 14 (fourteen) days.     norgestimate-ethinyl estradiol (SPRINTEC 28) 0.25-35 MG-MCG tablet Take 1 tablet by mouth daily. 28 tablet 0   pantoprazole (PROTONIX) 40 MG tablet Take 1 tablet (40 mg total) by mouth daily. 90 tablet 0   rosuvastatin (CRESTOR) 5 MG tablet Take by mouth.     venlafaxine XR (EFFEXOR-XR) 37.5 MG 24 hr capsule Take 37.5 mg by mouth daily.     No current facility-administered medications on file prior to visit.    No Known Allergies  Social History:  reports that she has never smoked. She has never used smokeless tobacco. She reports that she does not currently use alcohol. She reports that she does not use drugs.  Family History  Problem Relation Age of Onset   Hyperlipidemia Father    Hypertension Father    Hyperlipidemia Maternal Grandmother    Cancer Maternal Grandfather        lung   Diabetes Paternal Grandmother    Arthritis Paternal Grandfather    Cancer Paternal Grandfather        lung   Breast cancer Neg Hx    Ovarian cancer Neg Hx    Colon cancer Neg Hx     The following portions of the patient's history were  reviewed and updated as appropriate: allergies, current medications, past family history, past medical history, past social history, past surgical history and problem list.  Review of Systems Pertinent items noted in HPI and remainder of comprehensive ROS otherwise negative.  Physical Exam:  BP 114/74   Pulse 69   Ht '5\' 5"'$  (1.651 m)   Wt 167 lb (75.8 kg)   BMI 27.79 kg/m  CONSTITUTIONAL: Well-developed, well-nourished female in no acute distress.  HENT:  Normocephalic, atraumatic, External right and left ear normal. Oropharynx is clear and moist EYES: Conjunctivae and EOM are normal. Pupils are equal, round, and reactive to light. No scleral icterus.  NECK: Normal range of motion, supple, no masses.  Normal thyroid.  SKIN: Skin is warm and dry. No rash noted. Not diaphoretic. No  erythema. No pallor. MUSCULOSKELETAL: Normal range of motion. No tenderness.  No cyanosis, clubbing, or edema.  2+ distal pulses. NEUROLOGIC: Alert and oriented to person, place, and time. Normal reflexes, muscle tone coordination.  PSYCHIATRIC: Normal mood and affect. Normal behavior. Normal judgment and thought content. CARDIOVASCULAR: Normal heart rate noted, regular rhythm RESPIRATORY: Clear to auscultation bilaterally. Effort and breath sounds normal, no problems with respiration noted. BREASTS: Symmetric in size. No masses, tenderness, skin changes, nipple drainage, or lymphadenopathy bilaterally.  ABDOMEN: Soft, no distention noted.  No tenderness, rebound or guarding.  PELVIC: Normal appearing external genitalia and urethral meatus; normal appearing vaginal mucosa and cervix.  Ectropion present No abnormal discharge noted.  Pap smear obtained.  Normal uterine size, no other palpable masses, no uterine or adnexal tenderness.  .   Assessment and Plan:    1. Well woman exam with routine gynecological exam   Pap:Will follow up results of pap smear and manage accordingly. Mammogram :n/a Labs:none Refills: ocp Referral: none Routine preventative health maintenance measures emphasized. Please refer to After Visit Summary for other counseling recommendations.      Philip Aspen, CNM [facility]

## 2021-12-11 LAB — CYTOLOGY - PAP
Comment: NEGATIVE
Diagnosis: NEGATIVE
Diagnosis: REACTIVE
High risk HPV: NEGATIVE

## 2022-05-29 ENCOUNTER — Ambulatory Visit: Payer: 59 | Admitting: Dermatology

## 2022-05-29 VITALS — BP 103/68 | HR 76

## 2022-05-29 DIAGNOSIS — L578 Other skin changes due to chronic exposure to nonionizing radiation: Secondary | ICD-10-CM

## 2022-05-29 DIAGNOSIS — L813 Cafe au lait spots: Secondary | ICD-10-CM

## 2022-05-29 DIAGNOSIS — L814 Other melanin hyperpigmentation: Secondary | ICD-10-CM

## 2022-05-29 DIAGNOSIS — D2362 Other benign neoplasm of skin of left upper limb, including shoulder: Secondary | ICD-10-CM

## 2022-05-29 DIAGNOSIS — L739 Follicular disorder, unspecified: Secondary | ICD-10-CM

## 2022-05-29 DIAGNOSIS — L821 Other seborrheic keratosis: Secondary | ICD-10-CM

## 2022-05-29 DIAGNOSIS — D229 Melanocytic nevi, unspecified: Secondary | ICD-10-CM

## 2022-05-29 DIAGNOSIS — D2261 Melanocytic nevi of right upper limb, including shoulder: Secondary | ICD-10-CM | POA: Diagnosis not present

## 2022-05-29 DIAGNOSIS — Z1283 Encounter for screening for malignant neoplasm of skin: Secondary | ICD-10-CM

## 2022-05-29 DIAGNOSIS — D1801 Hemangioma of skin and subcutaneous tissue: Secondary | ICD-10-CM

## 2022-05-29 DIAGNOSIS — L918 Other hypertrophic disorders of the skin: Secondary | ICD-10-CM

## 2022-05-29 DIAGNOSIS — D239 Other benign neoplasm of skin, unspecified: Secondary | ICD-10-CM

## 2022-05-29 MED ORDER — CLINDAMYCIN PHOSPHATE 1 % EX FOAM: CUTANEOUS | 11 refills | Status: AC

## 2022-05-29 NOTE — Progress Notes (Signed)
New Patient Visit  Subjective  Toni Mccormick is a 32 y.o. female who presents for the following: TBSE.  The patient presents for Total-Body Skin Exam (TBSE) for skin cancer screening and mole check.  The patient has spots, moles and lesions to be evaluated, some may be new or changing. She has a history of bumps on her buttocks that come and go. No history of skin cancer or abnormal moles.    The following portions of the chart were reviewed this encounter and updated as appropriate:       Review of Systems:  No other skin or systemic complaints except as noted in HPI or Assessment and Plan.  Objective  Well appearing patient in no apparent distress; mood and affect are within normal limits.  A full examination was performed including scalp, head, eyes, ears, nose, lips, neck, chest, axillae, abdomen, back, buttocks, bilateral upper extremities, bilateral lower extremities, hands, feet, fingers, toes, fingernails, and toenails. All findings within normal limits unless otherwise noted below.  R post shoulder 2.0 mm dark brown macule, appears regular under dermoscopy       bil hip Follicular pink papules R > L post hip    Assessment & Plan  Skin cancer screening performed today.  Actinic Damage - chronic, secondary to cumulative UV radiation exposure/sun exposure over time - diffuse scaly erythematous macules with underlying dyspigmentation - Recommend daily broad spectrum sunscreen SPF 30+ to sun-exposed areas, reapply every 2 hours as needed.  - Recommend staying in the shade or wearing long sleeves, sun glasses (UVA+UVB protection) and wide brim hats (4-inch brim around the entire circumference of the hat). - Call for new or changing lesions.  Lentigines - Scattered tan macules - Due to sun exposure - Benign-appearing, observe - Recommend daily broad spectrum sunscreen SPF 30+ to sun-exposed areas, reapply every 2 hours as needed. - Call for any  changes  Dermatofibroma - Firm pink/brown papulenodule with dimple sign, left upper arm - Benign appearing - Call for any changes  Seborrheic Keratoses - Stuck-on, waxy, tan-brown papules and/or plaques on the right flank, abdomen - Benign-appearing - Discussed benign etiology and prognosis. - Observe - Call for any changes  Hemangiomas - Red papules - Discussed benign nature - Observe - Call for any changes  Acrochordons (Skin Tags) - Fleshy, skin-colored pedunculated papules on the neck - Benign appearing.  - Observe. - If desired, they can be removed with an in office procedure that is not covered by insurance. - Please call the clinic if you notice any new or changing lesions.  Melanocytic Nevi - Tan-brown and/or pink-flesh-colored symmetric macules and papules - Benign appearing on exam today - Observation - Call clinic for new or changing moles - Recommend daily use of broad spectrum spf 30+ sunscreen to sun-exposed areas.   Cafe au Lait  - Tan patch on the left lateral thigh, R post axilla - Genetic - Benign, observe - Call for any changes  Nevus R post shoulder  Benign-appearing.  Observation.  Call clinic for new or changing moles.  Recommend daily use of broad spectrum spf 30+ sunscreen to sun-exposed areas.   Folliculitis bil hip  Chronic and persistent condition with duration or expected duration over one year. Condition is symptomatic / bothersome to patient. Not to goal.  Sample of cLn cleanser given to patient.  Start Clindamycin foam Apply to AA QD after shower dsp 100g 1 yr rf.  Recommend OTC benzoyl peroxide cleanser, wash affected areas daily  in shower, let sit several minutes prior to rinsing.  May bleach towels if not rinsed off completely.  Recommended brands include Panoxyl 4% Creamy Wash, CeraVe Acne Foaming Cream wash, or Cetaphil Gentle Clear Complexion-Clearing BPO Acne Cleanser.  If not improving with topicals, discussed starting  doxycycline (low dose). Patient defers for now.   Clindamycin Phosphate foam - bil hip Apply to affected areas bumps once daily after shower.   Return in about 6 months (around 11/27/2022) for recheck mole R post shoulder. And folliculitis f/up.  IJamesetta Orleans, CMA, am acting as scribe for Brendolyn Patty, MD .  Documentation: I have reviewed the above documentation for accuracy and completeness, and I agree with the above.  Brendolyn Patty MD

## 2022-05-29 NOTE — Patient Instructions (Addendum)
Recommend OTC benzoyl peroxide cleanser, wash affected areas daily in shower, let sit several minutes prior to rinsing.  May bleach towels if not rinsed off completely.  Recommended brands include Panoxyl 4% Creamy Wash, CeraVe Acne Foaming Cream wash, or Cetaphil Gentle Clear Complexion-Clearing BPO Acne Cleanser. Benzoyl peroxide can cause dryness and irritation of the skin. It can also bleach fabric. When used together with Aczone (dapsone) cream, it can stain the skin orange.   Melanoma ABCDEs  Melanoma is the most dangerous type of skin cancer, and is the leading cause of death from skin disease.  You are more likely to develop melanoma if you: Have light-colored skin, light-colored eyes, or red or blond hair Spend a lot of time in the sun Tan regularly, either outdoors or in a tanning bed Have had blistering sunburns, especially during childhood Have a close family member who has had a melanoma Have atypical moles or large birthmarks  Early detection of melanoma is key since treatment is typically straightforward and cure rates are extremely high if we catch it early.   The first sign of melanoma is often a change in a mole or a new dark spot.  The ABCDE system is a way of remembering the signs of melanoma.  A for asymmetry:  The two halves do not match. B for border:  The edges of the growth are irregular. C for color:  A mixture of colors are present instead of an even brown color. D for diameter:  Melanomas are usually (but not always) greater than 24m - the size of a pencil eraser. E for evolution:  The spot keeps changing in size, shape, and color.  Please check your skin once per month between visits. You can use a small mirror in front and a large mirror behind you to keep an eye on the back side or your body.   If you see any new or changing lesions before your next follow-up, please call to schedule a visit.  Please continue daily skin protection including broad spectrum  sunscreen SPF 30+ to sun-exposed areas, reapplying every 2 hours as needed when you're outdoors.   Staying in the shade or wearing long sleeves, sun glasses (UVA+UVB protection) and wide brim hats (4-inch brim around the entire circumference of the hat) are also recommended for sun protection.      Due to recent changes in healthcare laws, you may see results of your pathology and/or laboratory studies on MyChart before the doctors have had a chance to review them. We understand that in some cases there may be results that are confusing or concerning to you. Please understand that not all results are received at the same time and often the doctors may need to interpret multiple results in order to provide you with the best plan of care or course of treatment. Therefore, we ask that you please give u[place]2 business days to thoroughly review all your results before contacting the office for clarification. Should we see a critical lab result, you will be contacted sooner.   If You Need Anything After Your Visit  If you have any questions or concerns for your doctor, please call our main line at [phone] press option 4 to reach your doctor's medical assistant. If no one answers, please leave a voicemail as directed and we will return your call as soon as possible. Messages left after 4 pm will be answered the following business day.   You may also send [place] message  via [place]. We typically respond to MyChart messages within 1-2 business days.  For prescription refills, please ask your pharmacy to contact our office. Our fax number is [phone].  If you have an urgent issue when the clinic is closed that cannot wait until the next business day, you can page your doctor at the number below.    Please note that while we do our best to be available for urgent issues outside of office hours, we are not available 24/7.   If you have an urgent issue and are unable to reach [place], you may choose to  seek medical care at your doctor's office, retail clinic, urgent care center, or emergency room.  If you have a medical emergency, please immediately call 911 or go to the emergency department.  Pager Numbers  - Dr. Nehemiah Massed: [phone]  - Dr. Laurence Ferrari: [phone]  - Dr. Nicole Kindred: [phone]  In the event of inclement weather, please call our main line at [phone] for an update on the status of any delays or closures.  Dermatology Medication Tips: Please keep the boxes that topical medications come in in order to help keep track of the instructions about where and how to use these. Pharmacies typically print the medication instructions only on the boxes and not directly on the medication tubes.   If your medication is too expensive, please contact our office at [phone] option 4 or send [place] a message through [place].   We are unable to tell what your co-pay for medications will be in advance as this is different depending on your insurance coverage. However, we may be able to find a substitute medication at lower cost or fill out paperwork to get insurance to cover a needed medication.   If a prior authorization is required to get your medication covered by your insurance company, please allow [place] 1-2 business days to complete this process.  Drug prices often vary depending on where the prescription is filled and some pharmacies may offer cheaper prices.  The website www.goodrx.com contains coupons for medications through different pharmacies. The prices here do not account for what the cost may be with help from insurance (it may be cheaper with your insurance), but the website can give you the price if you did not use any insurance.  - You can print the associated coupon and take it with your prescription to the pharmacy.  - You may also stop by our office during regular business hours and pick up a GoodRx coupon card.  - If you need your prescription sent electronically to a  different pharmacy, notify our office through [facility] or by phone at [phone] option 4.     Si Usted Necesita Algo Despus de Su Visita  Tambin puede enviarnos un mensaje a travs de Pharmacist, community. Por lo general respondemos a los mensajes de MyChart en el transcurso de 1 a 2 das hbiles.  Para renovar recetas, por favor pida a su farmacia que se ponga en contacto con nuestra oficina. Harland Dingwall de fax es [place] [phone].  Si tiene un asunto urgente cuando la clnica est cerrada y que no puede esperar hasta el siguiente da hbil, puede llamar/localizar a su doctor(a) al nmero que aparece a continuacin.   Por favor, tenga en cuenta que aunque hacemos todo lo posible para estar disponibles para asuntos urgentes fuera del horario de [place], no estamos disponibles las 24 horas del da, los 7 das de la [place].   Si tiene un problema urgente y  no puede comunicarse con nosotros, puede optar por buscar atencin mdica  en el consultorio de su doctor(a), en una clnica privada, en un centro de atencin urgente o en una sala de emergencias.  Si tiene Engineering geologist, por favor llame inmediatamente al 911 o vaya a la sala de emergencias.  Nmeros de bper  - Dr. Nehemiah Massed: [phone]  - Dra. Moye: [phone]  - Dra. Nicole Kindred: [phone]  En caso de inclemencias del [place], por favor llame a Johnsie Kindred principal al [phone] para una actualizacin sobre el [place] de cualquier retraso o cierre.  Consejos para la medicacin en dermatologa: Por favor, guarde las cajas en las que vienen los medicamentos de uso tpico para ayudarle a seguir las instrucciones sobre dnde y cmo usarlos. Las farmacias generalmente imprimen las instrucciones del medicamento slo en las cajas y no directamente en los tubos del [place].   Si su medicamento es muy caro, por favor, pngase en contacto con Zigmund Daniel llamando al [phone] y presione la opcin 4 o envenos un  mensaje a travs de Pharmacist, community.   No podemos decirle cul ser su copago por los medicamentos por adelantado ya que esto es diferente dependiendo de la cobertura de su seguro. Sin embargo, es posible que podamos encontrar un medicamento sustituto a Electrical engineer un formulario para que el seguro cubra el medicamento que se considera necesario.   Si se requiere una autorizacin previa para que su compaa de seguros [place] su medicamento, por favor permtanos de 1 a 2 das hbiles para completar este proceso.  Los precios de los medicamentos varan con frecuencia dependiendo del Environmental consultant de dnde se surte la receta y alguna farmacias pueden ofrecer precios ms baratos.  El sitio web www.goodrx.com tiene cupones para medicamentos de Airline pilot. Los precios aqu no tienen en cuenta lo que podra costar con la ayuda del seguro (puede ser ms barato con su seguro), pero el sitio web puede darle el precio si no utiliz Research scientist (physical sciences).  - Puede imprimir el cupn correspondiente y llevarlo con su receta a la farmacia.  - Tambin puede pasar por nuestra oficina durante el horario de atencin regular y Charity fundraiser una tarjeta de cupones de GoodRx.  - Si necesita que su receta se enve electrnicamente a una farmacia diferente, informe a nuestra oficina a travs de MyChart de [place] o por telfono llamando al [phone] y presione la opcin 4.

## 2022-12-10 ENCOUNTER — Ambulatory Visit: Payer: 59 | Admitting: Dermatology

## 2022-12-10 VITALS — BP 139/86 | HR 90

## 2022-12-10 DIAGNOSIS — L814 Other melanin hyperpigmentation: Secondary | ICD-10-CM | POA: Diagnosis not present

## 2022-12-10 DIAGNOSIS — D225 Melanocytic nevi of trunk: Secondary | ICD-10-CM

## 2022-12-10 DIAGNOSIS — D2261 Melanocytic nevi of right upper limb, including shoulder: Secondary | ICD-10-CM | POA: Diagnosis not present

## 2022-12-10 DIAGNOSIS — D229 Melanocytic nevi, unspecified: Secondary | ICD-10-CM

## 2022-12-10 NOTE — Progress Notes (Signed)
   Follow-Up Visit   Subjective  Toni Mccormick is a 32 y.o. female who presents for the following: 6 months f/u on a mole on the right post shoulder.  The patient has spots, moles and lesions to be evaluated, some may be new or changing and the patient may have concern these could be cancer.   The following portions of the chart were reviewed this encounter and updated as appropriate: medications, allergies, medical history  Review of Systems:  No other skin or systemic complaints except as noted in HPI or Assessment and Plan.  Objective  Well appearing patient in no apparent distress; mood and affect are within normal limits.    A focused examination was performed of the following areas:face,back    Relevant exam findings are noted in the Assessment and Plan.    Assessment & Plan   Nevus Exam: R post shoulder 1.5 mm dark brown macule No change when compared to photo   Treatment Plan:  Benign-appearing.  Stable. Observation.  Call clinic for new or changing moles.  Recommend daily use of broad spectrum spf 30+ sunscreen to sun-exposed areas.     MELANOCYTIC NEVI Exam: Tan-brown and/or pink-flesh-colored symmetric macules and papules back  Treatment Plan: Benign appearing on exam today. Recommend observation. Call clinic for new or changing moles. Recommend daily use of broad spectrum spf 30+ sunscreen to sun-exposed areas.    LENTIGINES Exam: scattered tan macules back, face Due to sun exposure  Treatment Plan: Benign-appearing, observe. Recommend daily broad spectrum sunscreen SPF 30+ to sun-exposed areas, reapply every 2 hours as needed.  Call for any changes    Return in about 6 months (around 06/09/2023) for TBSE.  I, Angelique Holm, CMA, am acting as scribe for Willeen Niece, MD .   Documentation: I have reviewed the above documentation for accuracy and completeness, and I agree with the above.  Willeen Niece, MD

## 2022-12-10 NOTE — Patient Instructions (Addendum)

## 2023-06-09 ENCOUNTER — Ambulatory Visit: Payer: 59 | Admitting: Dermatology

## 2023-06-09 DIAGNOSIS — W908XXA Exposure to other nonionizing radiation, initial encounter: Secondary | ICD-10-CM | POA: Diagnosis not present

## 2023-06-09 DIAGNOSIS — Z1283 Encounter for screening for malignant neoplasm of skin: Secondary | ICD-10-CM

## 2023-06-09 DIAGNOSIS — D2362 Other benign neoplasm of skin of left upper limb, including shoulder: Secondary | ICD-10-CM

## 2023-06-09 DIAGNOSIS — D2261 Melanocytic nevi of right upper limb, including shoulder: Secondary | ICD-10-CM

## 2023-06-09 DIAGNOSIS — L821 Other seborrheic keratosis: Secondary | ICD-10-CM

## 2023-06-09 DIAGNOSIS — D239 Other benign neoplasm of skin, unspecified: Secondary | ICD-10-CM

## 2023-06-09 DIAGNOSIS — D229 Melanocytic nevi, unspecified: Secondary | ICD-10-CM

## 2023-06-09 DIAGNOSIS — L814 Other melanin hyperpigmentation: Secondary | ICD-10-CM

## 2023-06-09 DIAGNOSIS — L578 Other skin changes due to chronic exposure to nonionizing radiation: Secondary | ICD-10-CM

## 2023-06-09 DIAGNOSIS — L918 Other hypertrophic disorders of the skin: Secondary | ICD-10-CM

## 2023-06-09 DIAGNOSIS — D1801 Hemangioma of skin and subcutaneous tissue: Secondary | ICD-10-CM

## 2023-06-09 DIAGNOSIS — L813 Cafe au lait spots: Secondary | ICD-10-CM

## 2023-06-09 NOTE — Progress Notes (Signed)
   Follow-Up Visit   Subjective  Toni Mccormick is a 33 y.o. female who presents for the following: Skin Cancer Screening and Full Body Skin Exam. No personal history of skin  The patient presents for Total-Body Skin Exam (TBSE) for skin cancer screening and mole check. The patient has spots, moles and lesions to be evaluated, some may be new or changing and the patient may have concern these could be cancer.   The following portions of the chart were reviewed this encounter and updated as appropriate: medications, allergies, medical history  Review of Systems:  No other skin or systemic complaints except as noted in HPI or Assessment and Plan.  Objective  Well appearing patient in no apparent distress; mood and affect are within normal limits.  A full examination was performed including scalp, head, eyes, ears, nose, lips, neck, chest, axillae, abdomen, back, buttocks, bilateral upper extremities, bilateral lower extremities, hands, feet, fingers, toes, fingernails, and toenails. All findings within normal limits unless otherwise noted below.   Relevant physical exam findings are noted in the Assessment and Plan.    Assessment & Plan   SKIN CANCER SCREENING PERFORMED TODAY.  ACTINIC DAMAGE - Chronic condition, secondary to cumulative UV/sun exposure - diffuse scaly erythematous macules with underlying dyspigmentation - Recommend daily broad spectrum sunscreen SPF 30+ to sun-exposed areas, reapply every 2 hours as needed.  - Staying in the shade or wearing long sleeves, sun glasses (UVA+UVB protection) and wide brim hats (4-inch brim around the entire circumference of the hat) are also recommended for sun protection.  - Call for new or changing lesions.  LENTIGINES, SEBORRHEIC KERATOSES, HEMANGIOMAS - Benign normal skin lesions - Benign-appearing - Call for any changes  MELANOCYTIC NEVI - Tan-brown and/or pink-flesh-colored symmetric macules and papules - R post shoulder  1.5 mm dark brown macule, photo compared, no change - Benign appearing on exam today. Stable - Observation - Call clinic for new or changing moles - Recommend daily use of broad spectrum spf 30+ sunscreen to sun-exposed areas.    Cafe au Lait Spots - Tan patch on the left lateral thigh, R post axilla - Genetic - Benign, observe - Call for any changes  Dermatofibroma - Firm pink/brown papulenodule with dimple sign, left upper arm - Benign appearing - Call for any changes   Acrochordons (Skin Tags) - Fleshy, skin-colored pedunculated papules at neck - Benign appearing.  - Observe. - If desired, they can be removed with an in office procedure that is not covered by insurance. - Please call the clinic if you notice any new or changing lesions.    Return in about 1 year (around 06/08/2024) for w/ Dr. Roseanne Reno, TBSE.  I, Soundra Pilon, CMA, am acting as scribe for Willeen Niece, MD .   Documentation: I have reviewed the above documentation for accuracy and completeness, and I agree with the above.  Willeen Niece, MD

## 2023-06-09 NOTE — Patient Instructions (Addendum)

## 2024-06-22 ENCOUNTER — Encounter: Admitting: Dermatology
